# Patient Record
Sex: Male | Born: 1945
Health system: Southern US, Community
[De-identification: ages and names within clinical notes are randomized; demographics above are authoritative.]

## PROBLEM LIST (undated history)

## (undated) DIAGNOSIS — Z87442 Personal history of urinary calculi: Secondary | ICD-10-CM

## (undated) DIAGNOSIS — R569 Unspecified convulsions: Secondary | ICD-10-CM

## (undated) DIAGNOSIS — L570 Actinic keratosis: Secondary | ICD-10-CM

## (undated) DIAGNOSIS — N200 Calculus of kidney: Secondary | ICD-10-CM

## (undated) DIAGNOSIS — I340 Nonrheumatic mitral (valve) insufficiency: Secondary | ICD-10-CM

## (undated) DIAGNOSIS — I803 Phlebitis and thrombophlebitis of lower extremities, unspecified: Secondary | ICD-10-CM

## (undated) DIAGNOSIS — M1990 Unspecified osteoarthritis, unspecified site: Secondary | ICD-10-CM

## (undated) DIAGNOSIS — Z9889 Other specified postprocedural states: Secondary | ICD-10-CM

## (undated) HISTORY — DX: Unspecified convulsions: R56.9

## (undated) HISTORY — DX: Phlebitis and thrombophlebitis of lower extremities, unspecified: I80.3

## (undated) HISTORY — DX: Calculus of kidney: N20.0

## (undated) HISTORY — DX: Nonrheumatic mitral (valve) insufficiency: I34.0

## (undated) HISTORY — DX: Other specified postprocedural states: Z98.890

## (undated) HISTORY — DX: Personal history of urinary calculi: Z87.442

## (undated) HISTORY — PX: VARICOSE VEIN SURGERY: SHX832

## (undated) HISTORY — DX: Unspecified osteoarthritis, unspecified site: M19.90

## (undated) HISTORY — DX: Actinic keratosis: L57.0

---

## 2000-07-08 DIAGNOSIS — N200 Calculus of kidney: Secondary | ICD-10-CM

## 2003-07-09 HISTORY — PX: COLONOSCOPY: SHX174

## 2006-11-20 ENCOUNTER — Encounter: Payer: Self-pay | Admitting: Cardiology

## 2006-11-20 DIAGNOSIS — Z720 Tobacco use: Secondary | ICD-10-CM | POA: Insufficient documentation

## 2006-12-03 ENCOUNTER — Ambulatory Visit: Payer: Self-pay | Admitting: Cardiology

## 2006-12-16 ENCOUNTER — Ambulatory Visit: Payer: Self-pay

## 2006-12-16 ENCOUNTER — Encounter: Payer: Self-pay | Admitting: Cardiology

## 2007-01-02 ENCOUNTER — Ambulatory Visit: Payer: Self-pay | Admitting: Cardiology

## 2007-07-09 HISTORY — PX: OTHER SURGICAL HISTORY: SHX169

## 2007-12-02 ENCOUNTER — Ambulatory Visit: Payer: Self-pay | Admitting: Cardiology

## 2007-12-02 ENCOUNTER — Encounter: Payer: Self-pay | Admitting: Cardiology

## 2007-12-08 ENCOUNTER — Ambulatory Visit: Payer: Self-pay | Admitting: Cardiology

## 2007-12-21 ENCOUNTER — Ambulatory Visit: Payer: Self-pay | Admitting: Thoracic Surgery (Cardiothoracic Vascular Surgery)

## 2007-12-24 ENCOUNTER — Encounter
Admission: RE | Admit: 2007-12-24 | Discharge: 2007-12-24 | Payer: Self-pay | Admitting: Thoracic Surgery (Cardiothoracic Vascular Surgery)

## 2007-12-31 ENCOUNTER — Ambulatory Visit: Payer: Self-pay | Admitting: Cardiology

## 2007-12-31 ENCOUNTER — Encounter (INDEPENDENT_AMBULATORY_CARE_PROVIDER_SITE_OTHER): Payer: Self-pay | Admitting: *Deleted

## 2007-12-31 LAB — CONVERTED CEMR LAB
BUN: 24 mg/dL — ABNORMAL HIGH (ref 6–23)
CO2: 21 meq/L (ref 19–32)
Chloride: 109 meq/L (ref 96–112)
Glucose, Bld: 115 mg/dL — ABNORMAL HIGH (ref 70–99)
HCT: 40.3 % (ref 39.0–52.0)
Hemoglobin: 12.9 g/dL — ABNORMAL LOW (ref 13.0–17.0)
MCHC: 32 g/dL (ref 30.0–36.0)
MCV: 92.2 fL (ref 78.0–100.0)
Potassium: 4.5 meq/L (ref 3.5–5.3)
RBC: 4.37 M/uL (ref 4.22–5.81)

## 2008-01-05 ENCOUNTER — Encounter: Payer: Self-pay | Admitting: Internal Medicine

## 2008-01-05 ENCOUNTER — Ambulatory Visit: Payer: Self-pay | Admitting: Internal Medicine

## 2008-01-05 ENCOUNTER — Inpatient Hospital Stay (HOSPITAL_BASED_OUTPATIENT_CLINIC_OR_DEPARTMENT_OTHER): Admission: RE | Admit: 2008-01-05 | Discharge: 2008-01-05 | Payer: Self-pay | Admitting: Internal Medicine

## 2008-01-05 ENCOUNTER — Ambulatory Visit (HOSPITAL_COMMUNITY): Admission: RE | Admit: 2008-01-05 | Discharge: 2008-01-05 | Payer: Self-pay | Admitting: Internal Medicine

## 2008-01-05 HISTORY — PX: CARDIAC CATHETERIZATION: SHX172

## 2008-01-13 ENCOUNTER — Ambulatory Visit: Payer: Self-pay | Admitting: Cardiology

## 2008-02-15 ENCOUNTER — Ambulatory Visit (HOSPITAL_COMMUNITY)
Admission: RE | Admit: 2008-02-15 | Discharge: 2008-02-15 | Payer: Self-pay | Admitting: Thoracic Surgery (Cardiothoracic Vascular Surgery)

## 2008-02-15 ENCOUNTER — Ambulatory Visit: Payer: Self-pay | Admitting: Thoracic Surgery (Cardiothoracic Vascular Surgery)

## 2008-02-15 ENCOUNTER — Ambulatory Visit: Payer: Self-pay | Admitting: *Deleted

## 2008-02-15 ENCOUNTER — Encounter: Payer: Self-pay | Admitting: Thoracic Surgery (Cardiothoracic Vascular Surgery)

## 2008-02-18 ENCOUNTER — Inpatient Hospital Stay (HOSPITAL_COMMUNITY)
Admission: RE | Admit: 2008-02-18 | Discharge: 2008-02-22 | Payer: Self-pay | Admitting: Thoracic Surgery (Cardiothoracic Vascular Surgery)

## 2008-02-18 ENCOUNTER — Encounter: Payer: Self-pay | Admitting: Thoracic Surgery (Cardiothoracic Vascular Surgery)

## 2008-02-18 ENCOUNTER — Ambulatory Visit: Payer: Self-pay | Admitting: Thoracic Surgery (Cardiothoracic Vascular Surgery)

## 2008-02-24 ENCOUNTER — Ambulatory Visit: Payer: Self-pay | Admitting: Internal Medicine

## 2008-02-26 ENCOUNTER — Ambulatory Visit: Payer: Self-pay | Admitting: Cardiovascular Disease

## 2008-02-29 ENCOUNTER — Encounter
Admission: RE | Admit: 2008-02-29 | Discharge: 2008-02-29 | Payer: Self-pay | Admitting: Thoracic Surgery (Cardiothoracic Vascular Surgery)

## 2008-02-29 ENCOUNTER — Ambulatory Visit: Payer: Self-pay | Admitting: Thoracic Surgery (Cardiothoracic Vascular Surgery)

## 2008-03-01 ENCOUNTER — Ambulatory Visit: Payer: Self-pay | Admitting: Cardiology

## 2008-03-17 ENCOUNTER — Ambulatory Visit: Payer: Self-pay | Admitting: Internal Medicine

## 2008-03-21 ENCOUNTER — Ambulatory Visit: Payer: Self-pay | Admitting: Internal Medicine

## 2008-03-28 ENCOUNTER — Ambulatory Visit: Payer: Self-pay | Admitting: Cardiology

## 2008-04-25 ENCOUNTER — Ambulatory Visit: Payer: Self-pay | Admitting: Internal Medicine

## 2008-04-29 ENCOUNTER — Ambulatory Visit: Payer: Self-pay | Admitting: Cardiovascular Disease

## 2008-05-09 ENCOUNTER — Ambulatory Visit: Payer: Self-pay | Admitting: Thoracic Surgery (Cardiothoracic Vascular Surgery)

## 2008-05-24 ENCOUNTER — Ambulatory Visit: Payer: Self-pay | Admitting: Cardiology

## 2009-03-03 DIAGNOSIS — N529 Male erectile dysfunction, unspecified: Secondary | ICD-10-CM

## 2009-05-08 ENCOUNTER — Ambulatory Visit: Payer: Self-pay | Admitting: Cardiology

## 2009-05-08 DIAGNOSIS — I059 Rheumatic mitral valve disease, unspecified: Secondary | ICD-10-CM | POA: Insufficient documentation

## 2009-05-09 ENCOUNTER — Encounter: Payer: Self-pay | Admitting: Cardiology

## 2009-05-09 ENCOUNTER — Ambulatory Visit: Payer: Self-pay

## 2010-05-08 ENCOUNTER — Telehealth (INDEPENDENT_AMBULATORY_CARE_PROVIDER_SITE_OTHER): Payer: Self-pay | Admitting: *Deleted

## 2010-05-22 ENCOUNTER — Encounter: Payer: Self-pay | Admitting: Cardiology

## 2010-05-22 ENCOUNTER — Ambulatory Visit: Payer: Self-pay

## 2010-05-24 ENCOUNTER — Telehealth: Payer: Self-pay | Admitting: Cardiology

## 2010-08-08 NOTE — Progress Notes (Signed)
Summary: test results   Phone Note Call from Patient Call back at Home Phone [phone]   Caller: Patient Summary of Call: Pt returning call for test results Initial call taken by: Judie Grieve,  May 24, 2010 4:15 PM  Follow-up for Phone Call        Pt aware of ECHO results. Mylo Red RN

## 2010-08-08 NOTE — Progress Notes (Signed)
Summary: CALLED PT   Phone Note Outgoing Call Call back at [facility] Phone [phone]   Call placed by: Harlon Flor,  May 08, 2010 10:52 AM Call placed to: Patient Summary of Call: [facility] TO SCHEDULE ECHO Initial call taken by: Harlon Flor,  May 08, 2010 10:52 AM

## 2010-10-11 ENCOUNTER — Encounter: Payer: Self-pay | Admitting: *Deleted

## 2010-10-15 ENCOUNTER — Encounter: Payer: Self-pay | Admitting: Cardiology

## 2010-10-15 ENCOUNTER — Ambulatory Visit (INDEPENDENT_AMBULATORY_CARE_PROVIDER_SITE_OTHER): Payer: 59 | Admitting: Cardiology

## 2010-10-15 VITALS — BP 134/84 | HR 83 | Resp 18 | Ht 73.0 in | Wt 195.0 lb

## 2010-10-15 DIAGNOSIS — I059 Rheumatic mitral valve disease, unspecified: Secondary | ICD-10-CM

## 2010-10-15 DIAGNOSIS — I341 Nonrheumatic mitral (valve) prolapse: Secondary | ICD-10-CM

## 2010-10-15 NOTE — Patient Instructions (Signed)
Your physician recommends that you schedule a follow-up appointment in: 1 year with Dr. Wall  

## 2010-10-15 NOTE — Assessment & Plan Note (Signed)
Stable

## 2010-10-15 NOTE — Progress Notes (Signed)
   Patient ID: Scott Potts, male    DOB: 1946-04-24, 65 y.o.   MRN: [id number]  HPI Scott Potts returns for E and M of his MVP and repair. He is doing well with no exertional limitations. He works out and plays golf on a regular basis. Last Echo was 11/11 and was normal with trivial Scott.  EKG show NSR with LAFB and IRBBB.   Review of Systems  All other systems reviewed and are negative.      Physical Exam  Constitutional: He is oriented to person, place, and time. He appears well-developed and well-nourished.  HENT:  Head: Normocephalic and atraumatic.  Eyes: EOM are normal.  Neck: Neck supple. No JVD present. No tracheal deviation present. No thyromegaly present.  Cardiovascular: Normal rate, regular rhythm and normal heart sounds.   No murmur heard. Pulmonary/Chest: Effort normal and breath sounds normal.  Abdominal: Soft. Bowel sounds are normal.  Musculoskeletal: He exhibits no edema.  Neurological: He is alert and oriented to person, place, and time.  Skin: Skin is warm and dry.  Psychiatric: He has a normal mood and affect.

## 2010-11-20 NOTE — Cardiovascular Report (Signed)
NAMEJARETT, DRALLE                 ACCOUNT NO.:  [id number]   MEDICAL RECORD NO.:  [id number]          PATIENT TYPE:  AMB   LOCATION:  ENDO                         FACILITY:  [facility]   PHYSICIAN:  Bevelyn Buckles. Bensimhon, MDDATE OF BIRTH:  [birth year]   DATE OF PROCEDURE:  DATE OF DISCHARGE:                            CARDIAC CATHETERIZATION   PRIMARY CARE PHYSICIAN:  Lorie Phenix.   CARDIOLOGIST:  Jesse Sans. Wall, MD, [facility]   PATIENT IDENTIFICATION:  [name] is a very pleasant 65 year old male  who was found to have mitral valve prolapse with severe mitral  regurgitation.  He is referred for a right and left heart  catheterization for preparation for mitral valve repair.   PROCEDURES PERFORMED:  1. Right heart cath.  2. Selective coronary angiography.  3. Left heart cath.  4. Left ventriculogram.   DESCRIPTION OF PROCEDURE:  The risks and indication were explained.  Consent was signed and placed on the chart.  A 4-French arterial sheath  was placed in the left femoral artery using the modified Seldinger  technique.  Standard catheters were used including a JL-4 3-D RCA and  angled pigtail.  A 7-French venous sheath was placed in the left femoral  vein using the modified Seldinger technique and a standard Swan-Ganz  catheter was used.   HEMODYNAMICS:  Right atrial pressure mean of 7, RV pressure 26/5.  PA  pressure 27/11 with a mean of 19.  Pulmonary capillary wedge pressure  mean of 14 with V-waves to 25.  Central aortic pressure 124/76 with a  mean of 99.  LV pressure 115/39 with EDP of 20.  Fick cardiac output was  6.5 L per minute.  Cardiac index was 3.1 L per minute per meter squared.   Left main was normal.   LAD gave off 2 diagonal branches that were normal.   Left circumflex gave off a long but small-to-moderate sized ramus, a  large OM-1, and two small posterolaterals that were angiographically  normal.  Right coronary artery was a large dominant vessel gave off a  PDA and posterolateral was angiographically normal.   Left ventriculogram done in the RAO position showed an EF of 60% with no  wall motion abnormalities.  There was 3+ mitral regurgitation.   ASSESSMENT:  1. Normal coronary arteries.  2. Normal left ventricular function.  3. Normal right heart pressures.  4. Severe mitral regurgitation secondary to mitral valve prolapse.   Plan will be for him to follow up with Dr. Cornelius Moras for mitral valve repair.      Bevelyn Buckles. Bensimhon, MD  Electronically Signed     DRB/MEDQ  D:  01/05/2008  T:  01/06/2008  Job:  [id number]   cc:   Golden Pop Daleen Squibb, MD, [facility]  Clarence H. Cornelius Moras, M.D.

## 2010-11-20 NOTE — Assessment & Plan Note (Signed)
[facility] OFFICE NOTE   Scott Potts, Scott Potts                        MRN:          [id number]  DATE:03/01/2008                            DOB:          11-02-1945    Ms. [name] comes in today.  He is about 2 weeks out from mitral valve  repair with Dr. Cornelius Moras.   He has done remarkably well and is actually swinging a golf club again!  He denies any shortness of breath, pleuritic chest pain, only some chest  wall soreness from the incision.  He denies any peripheral edema,  palpitations, syncope, or presyncope.   His meds are,  1. Toprol-XL 25 mg a day.  2. Aspirin 81 mg a day.  3. Coumadin as directed.  His INR was therapeutic on February 26, 2008,      at 2.4.  He will probably be on Coumadin for a total of 6-12 weeks      depending on how Dr. Cornelius Moras feels on his return followup.   His exam today, his blood pressure is 126/80, his pulse is 96 and  regular.  He has no JVD.  Lungs are clear.  There is no rub lateral  incision is healing nicely.  Heart reveals a nondisplaced PMI.  There is  no murmur.  There is no gallop.  There is no rub.   Scott Potts is doing remarkably well.  I have made no changes in his  medical program except I have told him he can stop his aspirin since his  coronaries were normal.  He will see Dr. Cornelius Moras back in October.  At that  time, he may stop his Coumadin.  I will see him back in November as well  and at that time we may stop his Toprol.     Thomas C. Daleen Squibb, MD, [facility]  Electronically Signed    TCW/MedQ  DD: 03/01/2008  DT: 03/02/2008  Job #: [id number]   cc:   Salvatore Decent. Cornelius Moras, M.D.  Lorie Phenix

## 2010-11-20 NOTE — Assessment & Plan Note (Signed)
OFFICE VISIT   Scott Potts, Scott Potts  DOB:  10-04-1945                                        February 29, 2008  CHART #:  [id number]   HISTORY OF PRESENT ILLNESS:  The patient returns for routine followup  status post right miniature thoracotomy for mitral valve repair on  February 18, 2008.  The patient's postoperative recovery has been entirely  uneventful.  Since hospital discharge, he has done remarkably well.  He  has had minimal soreness, and he is not taking any pain medicine  whatsoever.  Initially, he had poor appetite, but this has resolved and  he is now eating well.  He has no shortness of breath.  He is now  walking several miles a day without any problems.  He is eager to  increase his activity level further.  He has had his Coumadin dosing  monitored through the [facility] and so far is doing well.  He has no complaints.  Medications remain unchanged from the time of  hospital discharge.   PHYSICAL EXAMINATION:  GENERAL:  Notable for a well-appearing male with  blood pressure 110/72, pulse 90, oxygen saturation 97% on room air.  CHEST:  Reveals a well-healing right miniature thoracotomy incision.  His chest tube sutures are removed.  Breath sounds are clear to  auscultation.  No wheezes or rhonchi noted.  CARDIOVASCULAR:  Demonstrates regular rate and rhythm.  No murmurs are  noted on exam.  ABDOMEN:  Soft, nontender.  The right groin incision is healing nicely.  EXTREMITIES:  Warm and well perfused.  There is no lower extremity  edema.  Distal pulses are palpable at the ankle on both feet.   DIAGNOSTIC TESTS:  Chest x-ray obtained today at the [facility] is reviewed.  This demonstrates clear lung fields bilaterally.  There are no pleural effusions.  No other abnormalities are noted.   IMPRESSION:  The patient is doing quite well now less than 2 weeks  following his right miniature thoracotomy for mitral valve repair.   He  looks very good.   PLAN:  I have encouraged the patient to continue to gradually increase  his physical activity as tolerated.  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  He plans to go back to work next week.  I think he can  start playing golf as long as he sticks to a short game with limited  swing for another week or two, and within a couple of weeks, he can be  back to unrestricted activity.  All of his questions have been  addressed.  We will plan to keep him on Coumadin for 2 months or so  following his surgery.  He plans to see Dr. Daleen Squibb in followup tomorrow.  We will see him back in 2 months' time, at which point we will stop the  Coumadin if he continues to do well.   Salvatore Decent. Cornelius Moras, M.D.  Electronically Signed   CHO/MEDQ  D:  02/29/2008  T:  03/01/2008  Job:  [id number]   cc:   Thomas C. Daleen Squibb, MD, [facility]  Lorie Phenix

## 2010-11-20 NOTE — Assessment & Plan Note (Signed)
[facility] OFFICE NOTE   Scott Potts, Scott Potts                        MRN:          [id number]  DATE:01/13/2008                            DOB:          1945-10-17    Scott Potts returns today post catheterization and transesophageal echo.  He has severe mitral regurgitation secondary to prolapse, normal  coronary arteries.  He has already seen Dr. Tressie Stalker for a possible  repair which may be as early as August.  Scott Potts has a followup visit  soon for that.   His only concern today is that his blood pressure has been increasing.  I do note that today it is 154/86, last visit it was 147/82.  This may  need treatment postop.   His cath site is stable.  There is no bruit.  Rest of the exam is  unchanged.   All questions were answered and addressed.  He will let me know when his  surgery is scheduled.      Thomas C. Wall, MD, [facility]  Electronically Signed     Jesse Sans. Daleen Squibb, MD, [facility]  Electronically Signed   TCW/MedQ  DD: 01/13/2008  DT: 01/14/2008  Job #: [id number]   cc:   Electa Sniff. Cornelius Moras, M.D.

## 2010-11-20 NOTE — Op Note (Signed)
NAMECANDACE, Scott Potts                 ACCOUNT NO.:  [id number]   MEDICAL RECORD NO.:  [id number]          PATIENT TYPE:  INP   LOCATION:  2303                         FACILITY:  [facility]   PHYSICIAN:  Guadalupe Maple, M.D.  DATE OF BIRTH:  11-27-1945   DATE OF PROCEDURE:  02/18/2008  DATE OF DISCHARGE:                               OPERATIVE REPORT   PROCEDURE:  Intraoperative transesophageal echocardiography.   Mr. Layman Gully is a 65 year old white male with a history of severe  mitral regurgitation who is scheduled to undergo mitral valve repair by  Dr. Cornelius Moras.  A transesophageal echocardiography was requested to evaluate  the mitral valve to determine the mechanism of regurgitation and to  assess the adequacy of repair.   The patient was brought to the operating room of [facility] and  general anesthesia was induced without difficulty.  The trachea was  intubated without difficulty.  The transesophageal echocardiography  probe was then inserted into the esophagus without difficulty.   IMPRESSION:  Prebypass findings:  1. Mitral valve.  There was severe mitral regurgitation.  There was a      flail segment of the posterior leaflet in the P2 region with severe      mitral regurgitation, which was anteriorly directed along the      superior surface of the anterior leaflet.  There was a large area      of proximal flow convergence but regurgitant orifice area could not      be determined due to the angle of the jet, which could not be      aligned properly with the continuous wave Doppler; however, it      appeared to be severe.  The anterior leaflet appeared normal.      There was no evidence of flail segment, significant myxomatous      changes, or prolapsing areas.  There was no significant mitral      annular calcification.  2. Aortic valve.  The aortic valve was normal-appearing.  It was      trileaflet, opened normally without evidence of stenosis and there      was no  evidence of any aortic insufficiency.  There was no      calcification of the aortic leaflets.  3. Left ventricle.  The left ventricular cavity was dilated and      appeared hyperdynamic with good contractility and all segments      interrogated.  Ejection fraction estimated at 65%.  The ventricular      shape was globular.  The left ventricular wall thickness measured      1.1 cm in diastole at the posterior wall at the mid papillary      level.  Left ventricular end-diastolic diameter was 6.1 cm.  There      was no thrombus noted in the left ventricular apex.  4. Right ventricle.  The right ventricular size was normal.  There was      good contractility of the right ventricular free wall.  5. Tricuspid valve.  The tricuspid valve did appear somewhat  myxomatous and redundant and there was trace tricuspid      insufficiency. 6.  Interatrial septum.  The interatrial septum was      intact without evidence of patent foramen ovale or atrial septal      defect.  6. Left atrium.  The left atrial size appeared to be enlarged.  There      was no thrombus noted in the left atrial or left atrial appendage.  7. Ascending aorta.  The ascending aorta was well-defined with well-      defined root and sinotubular ridge and no atheromatous disease      noted.  8. Descending aorta.  The descending aorta appeared normal.  There was      no atheromatous disease noted and diameter was 2.7 cm.   Post Bypass Findings:  1. Mitral valve.  There was a mitral annuloplasty ring present.  The      posterior leaflet was immobile and the anterior leaflet opened      freely.  There was trace mitral insufficiency, which appeared      insignificant.  There was no evidence of systolic anterior motion      of the anterior leaflet and continuous wave Doppler interrogation      of the left ventricular outflow tract showed no evidence of outflow      tract obstruction.  2. Aortic valve.  The aortic valve was  unchanged from the prebypass      study.  There was normal opening of the valve and no aortic      insufficiency.  3. Left ventricle.  There was good contractility of the left ventricle      inner cavity and wall motion was normal in all segments      interrogated.  The ejection fraction was again estimated at 60-65%.           ______________________________  Guadalupe Maple, M.D.     DCJ/MEDQ  D:  02/18/2008  T:  02/19/2008  Job:  [id number]

## 2010-11-20 NOTE — Assessment & Plan Note (Signed)
OFFICE VISIT   Scott Potts, Scott Potts  DOB:  [birth year]                                        May 09, 2008  CHART #:  [id number]   HISTORY OF PRESENT ILLNESS:  The patient returns for routine followup  status post right miniature thoracotomy for mitral valve repair on  February 18, 2008.  He was last seen here in the office on February 29, 2008.  Since then, he has done remarkably well.  The patient reports  that his return to normal activity was even quicker than he anticipated.  He played 18 holes of golf and shot a 74 less than 2 weeks following his  surgery.  He returned to work and is back enjoying entirely normal  activity without any problems or complaints.  His exercise tolerance is  quite good.  He has no shortness of breath.  He has no fevers or chills.  He has no residual pain and he really did not have much pain at all even  early after the surgery.  He is delighted with how things had  progressed.  His only complaint is that it took sometime to get his  Coumadin dosing adjusted.  The remainder of his review of systems is  entirely unrevealing.   CURRENT MEDICATIONS:  Toprol-XL 25 mg daily and Coumadin 7.5 mg daily.   PHYSICAL EXAMINATION:  GENERAL:  The patient is a well-appearing male.  VITAL SIGNS:  Blood pressure 121/74, pulse 71 and regular, and oxygen  saturation 97% on room air.  CHEST:  The small right miniature thoracotomy incision healed  completely.  Breath sounds are clear to auscultation and symmetrical  bilaterally.  No wheezes or rhonchi are noted.  CARDIOVASCULAR:  Demonstrates regular rate and rhythm.  No murmurs,  rubs, or gallops are noted.  ABDOMEN:  Soft and nontender.  The right groin incision is healed  completely.  EXTREMITIES:  Warm and well perfused.  Pulses are palpable.  There is no  lower extremity edema.   IMPRESSION:  The patient has done extremely well following right mini-  thoracotomy for mitral valve repair.  I  think, we can stop his Coumadin  at this point in time.   PLAN:  I have instructed the patient to stop his Coumadin.  I have  suggested that he restart taking 81 mg of aspirin daily.  Whether or not  he needs to stay on Toprol-XL, I will defer to Dr. Daleen Squibb.  At some point,  a followup echocardiogram would be good to see, and the patient is not  sure if he has had one since his surgery yet.  All of his questions have  been addressed.  In the future, he will call or return to see [place] here at  [facility] only should further problems or  difficulties arise.   Salvatore Decent. Cornelius Moras, M.D.  Electronically Signed   CHO/MEDQ  D:  05/09/2008  T:  05/09/2008  Job:  [id number]   cc:   Thomas C. Daleen Squibb, MD, [facility]  Lorie Phenix

## 2010-11-20 NOTE — Assessment & Plan Note (Signed)
[facility] OFFICE NOTE   KENNAN, DETTER                          MRN:          [id number]  DATE:12/03/2006                            DOB:          [birth year]    I was asked to consult on Scott Potts by Dr. Lorie Phenix at [facility] concerning a murmur.   HISTORY OF PRESENT ILLNESS:  Scott Potts is a very active, pleasant, 65-  year-old, white male who went for an annual physical on Nov 20, 2006.  A  systolic murmur, grade 2/6, was heard.  This has not been heard before  per the patient.   He has no cardiac history.  He has no symptoms of dyspnea on exertion,  chest discomfort, palpitations, presyncope, or syncope, peripheral  edema, orthopnea, or PND.   PAST MEDICAL HISTORY:  Significant for intolerance to penicillin.  He  smokes less than 1/4 of a pack of cigarettes a day, and has for 20  years.  He drinks 1 alcoholic beverage a day.  He does have 24 ounces of  caffeinated beverage a day.  He does not have elevated lipids.  He  exercises on a universal machine, and walks around 20 minutes to an hour  daily.  He is in excellent physical shape on outward appearance.   FAMILY HISTORY:  Negative for premature coronary disease.   SURGICAL HISTORY:  He had a vein strip of the left leg in 1985.   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  He is divorced, has 2 children.  Lives in [place].   REVIEW OF SYSTEMS:  Other than kidney stone, is totally negative review  of systems.   EXAMINATION:  His blood pressure is 132/84.  Pulse 62 and regular.  He  is 6 feet 1 inch, and weighs 185.  HEENT:  Normocephalic and atraumatic.  PERRLA.  Extraocular movements  intact.  Sclerae are clear.  Facial symmetry is normal.  Dentition is  satisfactory.  NECK:  Supple.  Carotid upstrokes are equal bilaterally.  There is no  bisferiens quality or delay.  Thyroid is not  enlarged.  Trachea is  midline.  LUNGS:  Clear.  CHEST:  Exam reveals a nondisplaced PMI.  He has a normal S1, and a  split S2.  He has a late mid systolic murmur heard best at the apex, and  left lateral decubitus position.  I can hear no diastolic inflow murmur.  There is no gallop.  There is no insufficiency murmur along the left  sternal border.  Standing, his murmur gets louder.  It is grade 2 to 3  over 6 systolic, mid systolic.  ABDOMINAL EXAM:  Soft.  Good bowel sounds.  EXTREMITIES:  Reveal no cyanosis, clubbing, or edema.  Pulses are  intact.  NEUROLOGIC:  Exam is intact.  His EKG shows sinus rhythm with a normal PR, QRS, and QTC interval.  He  does have an RSR prime in V1 and V2.  I have no old EKGs to compare.   LABORATORY  DATA:  From Dr. Santiago Bur office was reviewed, and is really  unremarkable.  This was discussed with the patient, including his lipid  panel.  Specifically, he is not anemic.   ASSESSMENT:  1. Mid to late systolic murmur most consistent with late prolapse of      the mitral valve.  Possibility of hypertrophic obstructive      cardiomyopathy should be raised as well.  I doubt this is an aortic      outflow murmur based on what I am hearing.  2. Tobacco use.  3. History of vein stripping.  4. RSR prime in V1 and V2, asymptomatic.   RECOMMENDATIONS:  I have discussed this in detail with the patient using  a heart model.  A 2-D echocardiogram is warranted to differentiate the  etiology of this murmur.  We will call him with the results.  Assuming  that it is nothing major, we will see him back in a year, or p.r.n.     Scott C. Daleen Squibb, MD, [facility]  Electronically Signed    TCW/MedQ  DD: 12/03/2006  DT: 12/03/2006  Job #: [id number]

## 2010-11-20 NOTE — Discharge Summary (Signed)
[name], Scott Potts                 ACCOUNT NO.:  [id number]   MEDICAL RECORD NO.:  [id number]          PATIENT TYPE:  OUT   LOCATION:  VASC                         FACILITY:  [facility]   PHYSICIAN:  Salvatore Decent. Cornelius Moras, M.D. DATE OF BIRTH:  [birth year]   DATE OF ADMISSION:  02/15/2008  DATE OF DISCHARGE:  02/15/2008                               DISCHARGE SUMMARY   FINAL DIAGNOSIS:  Severe mitral regurgitation.   IN-HOSPITAL DIAGNOSES:  1. Acute blood loss anemia postoperatively.  2. Volume overload postoperatively.   HISTORY AND PHYSICAL AND HOSPITAL COURSE:  1. Status post vein stripping.  2. History of nephrolithiasis.  3. Left lower extremity thrombophlebitis.   IN-HOSPITAL OPERATIONS AND PROCEDURES:  Right miniature thoracotomy for  mitral valve repair with triangular resection of posterior leaflet with  30 mm Edwards Physio II Radio broadcast assistant.   THE PATIENT'S HISTORY AND PHYSICAL AND HOSPITAL COURSE:  The patient is  a 65 year old previously healthy male who was first noted to have a  murmur on physical exam 2 years previously.  Echocardiogram revealed  mitral valve prolapse with mitral regurgitation.  The patient has been  followed by Dr. Daleen Squibb and recently returned with followup.  Echo  demonstrating severe mitral regurgitation with increased left  ventricular chamber size.  The patient remains asymptomatic.  Transesophageal echocardiogram confirmed the presence of flail posterior  leaflet of the mitral valve with severe 4+ mitral regurgitation.  Cardiac catheterization done confirmed the presence of severe mitral  regurgitation and demonstrates normal coronary artery anatomy with no  significant coronary artery disease.  Dr. Cornelius Moras was consulted.  Dr. Cornelius Moras  discussed with the patient undergoing mini mitral valve repair.  He  discussed risks and benefits with the patient.  The patient acknowledged  understanding and agreed to proceed.  Surgery was scheduled for February 18, 2008.  For details of the patient's past medical history and  physical exam, please see dictated H&P.   The patient was taken to the operating room on February 18, 2008, where he  underwent right miniature thoracotomy for mitral valve repair.  He was  doing a triangular resection of posterior leaflet with 30 mm Edwards  Physio II Radio broadcast assistant.  The patient tolerated this procedure well  and was transferred to the Intensive Care Unit in stable condition.  Postoperatively, the patient was noted to be hemodynamically stable.  He  was extubated evening of surgery.  Post extubation, the patient was  noted to be alert and oriented x4 and neuro intact.  The patient's  postoperative course was pretty much unremarkable.  Postop day 1, the  patient was weaned from all drips.  Chest x-ray noted to be stable.  Mediastinal chest tube discontinued as well as lines.  Pleural chest  tube was left in place.  He was noted to be bradycardic and was  temporarily atrial paced.  Blood pressure was stable.  He did have some  mild anemia with hemoglobin/hematocrit of 10.7 and 31%, this was stable.  The patient was felt to be stable and ready for transfer out to [facility].  The  patient continued to progress well while on the floor.  Pacer was  turned off and heart rate remained in the 70s.  Blood pressure remained  stable.  He was tolerating a low-dose beta blocker well.  The patient  remained in normal sinus rhythm postoperatively.  H&H was monitored and  remained stable.  The patient did not require any transfusions  postoperatively.  He did have some mild volume overload started on  diuretics for.  This improved prior to discharge home.  Repeat chest x-  ray was done daily.  By postop day 3, he did have a small right apical  pneumothorax.  We planned to discontinue pleural chest tube today.  We  will check chest x-ray post discontinuation of chest tube.  The patient  was started on Coumadin for mitral valve  repair.  Daily PT/INRs were  obtained.  Coumadin was dosed appropriately.  The patient continued to  progress well with ambulation.  He was tolerating diet well.  No nausea  or vomiting noted.  All incisions are clean, dry, and intact and healing  well.   The patient is tentatively ready for discharge home in the a.m., postop  day 3, February 21, 2008, pending removed chest tube today and chest x-ray  remained stable.   FOLLOW-UP APPOINTMENTS:  Followup appointment will be arranged with Dr.  Cornelius Moras for 1 week.  The patient will need to obtain PA and lateral chest x-  ray 30 minutes prior to this appointment.  The patient will need to  follow up with Dr. Daleen Squibb in 2 weeks.  He will need to contact Dr. Vern Claude  office to make these arrangements.  The patient will need to obtain  PT/INR blood work 2 days following discharge.   ACTIVITY:  The patient instructed no driving until released to do so, no  heavy lifting over 10 pounds.  He is told to ambulate 2-4 times per day,  progress as tolerated, and to continue his breathing exercises.   INCISIONAL CARE:  The patient is told to shower, washing his incisions  using soap and water.  He is to contact the office if he develops any  drainage or opening from any of his incision sites.   DIET:  Patient educated on diet to be low-fat, low-salt.   DISCHARGE MEDICATIONS:  1. Aspirin 81 mg daily.  2. Toprol XL 25 mg daily.  3. Coumadin will be dosed based on the patient's discharge PT/INR      level.  4. Ultram 50 mg 1-2 tablets q.4-6 hours p.r.n.      Theda Belfast, PA      Salvatore Decent. Cornelius Moras, M.D.  Electronically Signed    KMD/MEDQ  D:  02/21/2008  T:  02/22/2008  Job:  [id number]   cc:   Salvatore Decent. Cornelius Moras, M.D.  Thomas C. Wall, MD, [facility]

## 2010-11-20 NOTE — Consult Note (Signed)
NEW PATIENT CONSULTATION   Scott Potts, Scott Potts  DOB:  June 13, 1946                                        December 21, 2007  CHART #:  [id number]   REQUESTING PHYSICIAN:  Jesse Sans. Daleen Squibb, MD, [facility]   PRIMARY CARE PHYSICIAN:  Dr. Lorie Phenix.   REASON FOR CONSULTATION:  Severe mitral regurgitation.   HISTORY OF PRESENT ILLNESS:  Scott Potts is a 65 year old previously  healthy male who was first noted to have a heart murmur on routine  physical exam 2 years ago.  An echocardiogram was performed  demonstrating mitral valve prolapse with mitral regurgitation.  Since  then, he has been followed by Dr. Daleen Squibb.  He recently returned for  routine followup.  Echocardiogram performed on Dec 02, 2007, shows  mitral valve prolapse with likely severe mitral regurgitation.  There  has been some increased size of left ventricular chamber over the  interval past year since his last echo.  Scott Potts remains  asymptomatic, but he is now referred for consultation to consider  elective mitral valve repair.   REVIEW OF SYSTEMS:  GENERAL:  The patient feels well.  He has normal  exercise tolerance.  He has normal appetite.  HEENT:  Negative.  The patient has good dentition and sees his dentist  twice annually for routine followup.  CARDIAC:  The patient specifically denies any shortness of breath either  with activity or at rest.  He remains active physically.  He denies any  tachy palpitations.  He denies any chest pain.  He has not had any dizzy  spells.  RESPIRATORY:  Negative.  The patient denies productive cough,  hemoptysis, or wheezing.  GASTROINTESTINAL:  Negative.  The patient reports normal bowel function.  He has no difficulty swallowing.  He denies hematochezia, hematemesis,  or melena.  GENITOURINARY:  Negative.  The patient reports no urinary urgency or  frequency.  PERIPHERAL VASCULAR:  Negative.  The patient denies symptoms suggestive  of claudication.  NEUROLOGIC:   Negative.  PSYCHIATRIC:  Negative.  HEMATOLOGIC:  Negative.   PAST MEDICAL HISTORY:  1. Kidney stone.  2. Left lower extremity thrombophlebitis.   PAST SURGICAL HISTORY:  1. Transurethral extraction of kidney stone.  2. Left lower extremity vein stripping.   FAMILY HISTORY:  Noncontributory.  Notable specifically for the absence  of any family members with history of valvular heart disease, Marfan  syndrome, aortic dissection, or other collagen vascular diseases.   SOCIAL HISTORY:  The patient is divorced and lives alone in [place].  He has 2 grown children with 1 grandchild.  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  He remains very active physically.  He has a  long-standing history of tobacco use, although he smokes very small  amounts of cigarettes, perhaps 1 pack of cigarettes per week at most.  He has never been a heavy smoker.  He denies excessive alcohol  consumption.   CURRENT MEDICATIONS:  None.   DRUG ALLERGIES:  PENICILLIN caused swelling during his childhood.   PHYSICAL EXAMINATION:  General:  The patient is a thin well-appearing  white male who appears of stated age in no acute distress.  Vital Signs:  Blood pressure 146/87, pulse 60, and oxygen saturation 98% on room air.  HEENT:  Unrevealing.  Neck:  Supple.  There are no carotid  bruits.  There is no jugular venous distention.  There is no palpable  lymphadenopathy.  Chest:  Auscultation of the chest demonstrates clear  breath sounds, which are symmetrical bilaterally.  No wheezes or rhonchi  noted.  Cardiovascular:  Notable for regular rate and rhythm.  There is  a very prominent 4/6 holosystolic murmur heard best at the apex with  radiation to the axilla and to the back.  No diastolic murmurs are  noted.  Abdomen:  Flat, soft, nondistended, and nontender.  There are no  palpable masses.  The liver edge is not palpable.  Extremities:  Warm  and well perfused.  Femoral pulses are strong and easily  palpable on  both sides.  Distal pulses are palpable in the feet.  There is no sign  of significant venous insufficiency.  Rectal:  Deferred.  GU:  Deferred.  Neurologic:  Grossly nonfocal and symmetrical throughout.   DIAGNOSTIC TESTS:  A 2-D echocardiogram performed on Dec 02, 2007, is  reviewed.  This demonstrates mitral valve prolapse with severe prolapse  involving what appears to be the middle scallop of the posterior leaflet  of the mitral valve (P2).  There is a jet of mitral regurgitation  coursing anteriorly around the left atrium consistent with severe mitral  regurgitation.  There is mild left ventricular chamber enlargement.  There is normal left ventricular systolic function.  The aortic valve is  normal and there is no aortic insufficiency.  The aortic root is  somewhat dilated.   IMPRESSION:  Mitral valve prolapse with probably severe mitral  regurgitation.  Scott Potts claims to remain asymptomatic, although there  had been some interval increased size of his left ventricular chamber  over the last year between serial echocardiograms.  I think it would be  reasonable to pursue further diagnostic workup to consider elective  mitral valve repair.  Assuming he does not have significant coronary  artery disease, Scott Potts would likely represent an excellent candidate  for minimally invasive approach.  He does have an enlarged aortic root;  he certainly could conceivably have an ascending thoracic aortic  aneurysm.  We will plan CT angiogram for further preoperative screening  purposes.   PLAN:  I have discussed matters at length with Scott Potts in the office  today.  Alternative treatment strategies have been discussed including  continued close observation with medical therapy versus proceeding with  elective mitral valve repair.  The relative risks and benefits of each  of these strategies have been discussed.  Scott Potts desires to proceed  with elective surgery some  time near March 08, 2008.  We will obtain  CT angiogram of the thoracic and abdominal aorta and its branches, both  to size his ascending thoracic aorta and to screen him for peripheral  vascular disease with respect to tentative plans for minimally invasive  surgical approach.  At some point, he will need left and right heart  catheterization and a transesophageal echocardiogram.  We will ask that  cardiac catheterization be performed through the left femoral approach  if feasible and we will plan using the right femoral approach for  cannulation, presuming that he remains  a candidate for minimally  invasive technique.  All of his questions have been addressed.  We will  plan to see Scott Potts back here in the office for followup on February 22, 2008.   Salvatore Decent. Cornelius Moras, M.D.  Electronically Signed   CHO/MEDQ  D:  12/21/2007  T:  12/22/2007  Job:  [id number]   cc:   Thomas C. Daleen Squibb, MD, [facility]  Lorie Phenix

## 2010-11-20 NOTE — Assessment & Plan Note (Signed)
[facility] OFFICE NOTE   Scott Potts, CATANZARO                          MRN:          [id number]  DATE:01/02/2007                            DOB:          May 02, 1946    Scott Potts returns today to discuss the findings of his 2D  echocardiogram.   He has mitral valve prolapse to the anterior leaflet. There is no  evidence of HOCM. He has moderate mitral valve regurgitation and mild  left atrial dilation. His left atrium specifically measured 30 which is  actually normal. It certainly is mild. His left ventricle in end  diastole is normal with no sign of dilation.   He is totally asymptomatic. He does do a fair amount of weight lifting  but promises me he does not strain.   I have had about a 20 minute discussion with him today. I have  recommended no isometric lifting but cardio and isotonic lifting is ok.  We talked about the controversy over ACE inhibitors to provide  __________ reduction. He really does not want to take a medication.   We will plan on seeing him back in 6 months. We will certainly reecho  him every year. At some point he will probably need mitral valve repair  which we discussed at length.     Thomas C. Daleen Squibb, MD, [facility]  Electronically Signed    TCW/MedQ  DD: 01/02/2007  DT: 01/02/2007  Job #: [id number]   cc:   Lorie Phenix

## 2010-11-20 NOTE — H&P (Signed)
NAMEKEIRAN, GAFFEY                 ACCOUNT NO.:  [id number]   MEDICAL RECORD NO.:  [id number]          PATIENT TYPE:  AMB   LOCATION:  ENDO                         FACILITY:  [facility]   PHYSICIAN:  Bevelyn Buckles. Bensimhon, MDDATE OF BIRTH:  February 09, 1946   DATE OF ADMISSION:  01/05/2008  DATE OF DISCHARGE:                              HISTORY & PHYSICAL   PRIMARY CARDIOLOGIST:  Maisie Fus C. Daleen Squibb, MD, [facility]   PRIMARY CARE PHYSICIAN:  Dr. Leatha Gilding.   CARDIOVASCULAR SURGEON:  Salvatore Decent. Cornelius Moras, MD   HISTORY OF PRESENT ILLNESS:  This is a 65 year old Caucasian male with  no prior history of cardiac disease who was initially evaluated by Dr.  Juanito Doom in the [place] office secondary to a 2/6 systolic murmur.  The patient was asymptomatic with this and continues to be very active  walking and exercising each day.  An echocardiogram was completed in May  2009 revealing mitral valve prolapse, likely severe MR with increased LV  size (see attached echo report for more details).  He was referred to  Dr. Tressie Stalker and was seen by him on December 21, 2007 for evaluation  and consideration of mitral valve repair with preop evaluation (see  attached note from Dr. Barry Dienes).  Dr. Barry Dienes recommended a right and left  heart cath and a TEE.  The patient is here for TEE and cath today with  more recommendations per Dr. Gala Romney after procedures are completed.   REVIEW OF SYSTEMS:  Negative for shortness of breath, nausea, vomiting,  chest pain, dizziness, or lack of energy.   PAST MEDICAL HISTORY:  1. Vein stripping.  2. Kidney stones.  3. Left lower extremity thrombophlebitis.   FAMILY HISTORY:  Negative for premature CAD and valvular disease.   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  Positive for tobacco, use one-quarter pack a day and 1 drink daily.   PAST SURGICAL HISTORY:  Vein stripping.   CURRENT MEDICATIONS:  None.   ALLERGIES:  PENICILLIN causing swelling.   CURRENT  LABORATORY DATA:  Hemoglobin 12.9, hematocrit 40.3, white blood  cells 6.5, platelets 227.  Sodium 143, potassium 4.5, chloride 109, CO2  21, glucose 115, BUN 24, creatinine 0.85, calcium 9.0.  Chest x-ray  revealing normal two-view chest examination.  Echocardiogram dated Dec 02, 2007 revealing moderately severe prolapse of the posterior leaflet,  eccentric anterior MR that is consistent with posterior prolapse.  MR is  at least moderate as it wraps the atrium.  There is mild redundancy of  the mitral leaflets with no significant calcification.  Mean transmitral  gradient was 1.5 mmHg.  Mitral valve area pressure half-time was 3.0 and  5 cm2 per Dr. Willa Rough.   PHYSICAL EXAMINATION:  VITAL SIGNS:  Blood pressure 147/85, heart rate  67, respirations 20, O2 sat 97%.  HEENT:  Head is normocephalic and atraumatic.  Eyes, PERRLA.  Mucous  membranes of mouth are pink and moist.  Tongue is midline.  NECK:  Supple without JVD or carotid bruits appreciated.  CARDIOVASCULAR:  Regular rate and rhythm with 2/6  systolic murmur  auscultated.  There are no rubs or gallops noted.  LUNGS:  Clear to auscultation without wheezes, rales, or rhonchi.  ABDOMEN:  Soft, nontender, 2+ bowel sounds.  EXTREMITIES:  Without clubbing, cyanosis, or edema.  Dorsalis pedis  pulses and posterior tibial pulses with radial pulses are 1+  bilaterally.  SKIN:  Warm and dry.  NEUROLOGIC:  Intact.   IMPRESSION:  Severe mitral valve regurgitation.   PLAN:  The patient is scheduled for a TEE per Dr. Arvilla Meres and  right and left heart cath following.  The patient will have further  recommendations post procedure for consideration for mitral valve repair  or replacement.      Bettey Mare. Lyman Bishop, NP      Bevelyn Buckles. Bensimhon, MD  Electronically Signed    KML/MEDQ  D:  01/05/2008  T:  01/05/2008  Job:  [id number]   cc:   Thomas C. Daleen Squibb, MD, [facility]  Clarence H. Cornelius Moras, M.D.  Leatha Gilding, M.D.

## 2010-11-20 NOTE — H&P (Signed)
HISTORY AND PHYSICAL EXAMINATION   February 15, 2008   Re:  Scott Potts, Scott Potts           DOB:  1946/05/12   Date of planned hospital admission:  February 18, 2008.   HISTORY OF PRESENT ILLNESS:  The patient returns for further followup  related to mitral valve prolapse with severe mitral regurgitation.  He  was originally seen in consultation on December 21, 2007.  Since then he  underwent transesophageal echocardiogram and right heart catheterization  on January 05, 2008.  Transesophageal echocardiogram confirms the presence  of myxomatous mitral valve disease with flail posterior leaflet of the  mitral valve and severe (4+) mitral regurgitation.  There is normal left  ventricular size and function.  No other significant abnormalities were  appreciated.  Cardiac catheterization confirmed the presence of severe  mitral regurgitation.  There was normal coronary arteries with no  significant coronary artery disease.  Pulmonary artery pressures were  normal, measuring 27/11 with pulmonary capillary wedge pressure of 14  and a V wave of 25.  Resting cardiac output was 6.5 liters per minute  corresponding to cardiac index of 3.1.  The patient also underwent CT  angiogram of the thoracic and abdominal aorta as well as the iliac  vessels.  This was performed on December 24, 2007.  There was mild  dilatation of the ascending thoracic aorta, measuring 4 x 3.6 cm in its  greatest diameter.  There was no sign of significant atherosclerotic  disease.  No other abnormalities were noted.  Prostate gland was mildly  enlarged.   Since his last office visit, the patient remains clinically stable and  entirely asymptomatic.  His review of systems and physical exam are  entirely unchanged from previously.  He is not taking any regular  medications.  For the remainder of the details of his history and  physical exam, please see his previous consultation report dated December 21, 2007.   I have again  reviewed the indications, risks, and potential benefits of  elective mitral valve repair with the patient here in the office today.  The relative risks and benefits of use of minimally invasive approach to  reconstruction of his valve have been discussed.  He understands and  accepts all potential associated risks of surgery including but not  limited to risk of death, stroke, myocardial infarction, congestive  heart failure, respiratory failure, pneumonia, bleeding requiring blood  transfusion, arrhythmia, heart block, bradycardia, requiring permanent  pacemaker, recurrent right pleural effusion related to minimally  invasive approach, possible pain or numbness in the right groin or right  leg, possible vascular compromise to the right lower extremity.  He  understands that we feel there is a very high likelihood that his valve  can be successfully repaired.  If for some reason his valve cannot be  repaired, he specifically requests that we use a mechanical prosthesis.  He understands that this would be associated with the need for long-term  anticoagulation and associated risks of bleeding and/or  thromboembolization.  All of his questions have been addressed.  We plan  to proceed with surgery on Thursday, February 18, 2008.   Salvatore Decent. Cornelius Moras, M.D.  Electronically Signed   CHO/MEDQ  D:  02/15/2008  T:  02/15/2008  Job:  [id number]   cc:   Golden Pop. Wall, MD, [facility]

## 2010-11-20 NOTE — Op Note (Signed)
NAMEJEREMIYAH, CULLENS                 ACCOUNT NO.:  [id number]   MEDICAL RECORD NO.:  [id number]          PATIENT TYPE:  INP   LOCATION:  2303                         FACILITY:  [facility]   PHYSICIAN:  Salvatore Decent. Cornelius Moras, M.D. DATE OF BIRTH:  Jan 06, 1946   DATE OF PROCEDURE:  DATE OF DISCHARGE:                               OPERATIVE REPORT   PREOPERATIVE DIAGNOSIS:  Severe mitral regurgitation.   POSTOPERATIVE DIAGNOSIS:  Severe mitral regurgitation.   PROCEDURE:  Right miniature thoracotomy for mitral valve repair  (triangular resection of posterior leaflet with 30-mm Edwards Physio II  ring annuloplasty).   SURGEON:  Salvatore Decent. Cornelius Moras, MD   ASSISTANT:  Doree Fudge, PA   ANESTHESIA:  General.   BRIEF CLINICAL NOTE:  The patient is a 65 year old previously healthy  male who was first noted to have a murmur on physical exam 2 years  previously.  Echocardiograms reveal mitral valve prolapse with mitral  regurgitation.  The patient has been followed by Dr. Valera Castle and  recently returned with followup echo demonstrating severe mitral  regurgitation with increased left ventricular chamber size.  The patient  remains asymptomatic.  Transesophageal echocardiogram confirms the  presence of flail posterior leaflet of the mitral valve (P2) with severe  (4+) mitral regurgitation.  Cardiac catheterization confirms the  presence of severe mitral regurgitation and demonstrates normal coronary  artery anatomy with no significant coronary artery disease.  A full  consultation note has been dictated previously.  The patient has been  counseled at length regarding the indications, risks, and potential  benefits of surgery.  Alternative treatment strategies have been  reviewed.  He understands and accepts all associated risks and desires  to proceed with surgery as described.   OPERATIVE FINDINGS:  1. Flail middle scallop (P2) of the posterior leaflet of the mitral      valve with severe  (4+) mitral regurgitation.  2. Otherwise fairly normal mitral valve anatomy consistent with      fibroelastic deficiency type myxomatous degeneration.  3. Mild-to-moderately dilated left ventricle with normal left      ventricular systolic function.  4. No residual mitral regurgitation following successful mitral valve      repair.   OPERATIVE NOTE IN DETAIL:  The patient was brought to the operating room  on the above-mentioned date and central monitoring was established by  the Anesthesia Service under the care and direction of Dr. Kipp Brood.  Specifically, a Swan-Ganz catheter was placed through the left internal  jugular approach.  A radial arterial line was placed.  Intravenous  antibiotics were administered.  The patient was placed in the supine  position on the operating table.  General endotracheal anesthesia was  induced uneventfully.  Initially, a dual-lumen endotracheal tube was  placed.  A Foley catheter was placed.   Baseline transesophageal echocardiogram was performed by Dr. Noreene Larsson.  This confirms the presence of myxomatous mitral valve degenerative  disease with flail middle scallop (P2) of the posterior leaflet of  mitral valve and severe (4+) mitral regurgitation.  The left ventricle  was somewhat dilated.  There  was normal left ventricular systolic  function.  The aortic valve appears normal.  No other abnormalities were  noted.   The patient was positioned on the table with a roll behind the right  scapula along the right side.  The patient's head was turned gently  towards the left side.  The patient's right neck, chest, abdomen, both  groins, and both lower extremities were prepared and draped in sterile  manner.   A small oblique incision was made in the right inguinal crease.  The  right common femoral artery and common femoral vein were dissected  through the incision and inspected.  They were normal in appearance.   A right miniature anterolateral  thoracotomy incision was performed  immediately lateral to the right nipple.  The serratus anterior muscle  was preserved and retracted anteriorly.  Single-lung ventilation was  begun.  The right chest was entered through the fourth intercostal  space.  A small incision was made in the anterior axillary line  overlying the seventh intercostal space.  Through this incision, an 11-  mm endoscopic port was passed into the right pleural space.  Carbon  dioxide gas was insufflated into the right chest throughout the  remainder of the operation through this port.  A longitudinal incision  was made in the pericardium 2 cm anterior to the phrenic nerve.  Silk  pericardial retraction stitches were placed and pulled through separate  stab incisions with an Endoclose retrieval to facilitate exposure of the  pericardial sac.   The right common femoral vein was cannulated using the Seldinger  technique with needle placed through this greater saphenous bulb into  the common femoral vein.  The femoral guidewire was advanced through the  femoral vein in the inferior vena cava into the right atrium and into  the superior vena cava using transesophageal echocardiogram for  guidance.  The right internal jugular vein was then cannulated in the  right neck using the Seldinger technique, and a guidewire was advanced  in the superior vena cava again with transesophageal echocardiogram for  guidance.  The patient was heparinized systemically.  A 23 x 25 long  femoral venous cannula was then advanced after serial dilatation of the  common femoral vein through the greater saphenous bulb into the femoral  vein and up through the inferior vena cava and through the right atrium  until the tip can be appreciated in the superior vena cava using  transesophageal echocardiogram for guidance.  A 16-French femoral  cannula was then advanced over serial dilators into the internal jugular  vein until the tip was in the  superior vena cava.  CD-4 Gore-Tex  pursestring sutures were placed in both the femoral vein and the femoral  artery, and the right common femoral artery was then cannulated using  the Seldinger technique through the pursestring suture.  A 19-French  femoral arterial cannula was advanced after serial dilatation into the  common femoral artery.   Adequate heparinization was verified.  Cardiopulmonary bypass was begun.  The pericardial incision is now extended and both directions to  facilitate full exposure of the heart and aorta.  The underside of the  aorta was dissected away from the anterior surface of the right  pulmonary artery to facilitate subsequent cross-clamp placement.  Prolene 4-0 pursestring sutures were placed in the right lateral wall of  the ascending aorta just above the right atrial appendage for antegrade  cardioplegic cannula placement.  Prolene 4-0 suture was then placed in  the right atrium.  A retrograde cardioplegic catheter was placed through  the right atrium into the coronary sinus using transesophageal  echocardiogram for guidance.  An antegrade cardioplegic cannula was then  placed through the pursestring suture into the ascending aorta.   The patient was cooled to 28 degrees systemic temperature.  Aortic cross-  clamp was applied, and cold blood cardioplegia was administered  initially in antegrade fashion through the aortic root.  Supplemental  cardioplegia was administered retrograde through the coronary sinus  catheter.  The initial cardioplegic arrest was excellent.  Repeat doses  of cardioplegia were administered intermittently throughout the cross-  clamp portion of the operation retrograde through the coronary sinus  catheter to maintain completely isoelectric electrocardiogram.  Cardioplegia was administered every 20 minutes.   A left atriotomy incision was performed posteriorly through the  interatrial groove.  The mitral valve was exposed using a  left atrial  retractor which was attached to a bar placed directly through the fourth  intercostal space just lateral to the right margin of the sternum.  Exposure was felt to be excellent.  The mitral valve was carefully  examined.  There is myxomatous fibroelastic deficiency type prolapse of  the mitral valve with a flail middle scallop (P2) of the posterior  leaflet.  Two of the primary chordae tendineae in the segment of the  leaflet are ruptured.  The remainder of the valve appears essentially  normal, and there were no other areas of prolapse.  The subvalvular  apparatus was otherwise normal.   Interrupted 2-0 Ethibond horizontal mattress sutures were placed  circumferentially around the mitral annulus to serve as annuloplasty  sutures and to facilitate exposure of the remainder of the repair.  After all the stitches were in place, the prolapsed portion of the  middle scallop is now carefully examined.  The prolapsed segment was  repaired by performing a triangular resection of the flail segment.  The  intervening vertical defect of the valve was then closed using  interrupted CV-6 Gore-Tex sutures.  At this juncture, the valve was  tested and it appears to be competent.  The valve was sized to accept a  30-mm annuloplasty ring.  This corresponds precisely to the dimensions  of the anterior leaflet.   An Edwards Physio II annuloplasty ring (model #[id number], serial [id number])  was secured in place uneventfully.  After completion of the valve  repair, the valve was again inspected for competency by instilling  saline into the left ventricular chamber.  The valve appears to be  perfectly competent with no residual mitral regurgitation.  There was a  symmetrical coaptation line of the anterior and posterior leaflet with  broad surface area of coaptation.  Rewarming was begun.   The left atriotomy incision was closed using a two-layer closure of  running 3-0 Prolene suture.  Left  ventricular vent was initially left  across the valve and kept in place with a Rumel tourniquet across the  atriotomy closure.  An epicardial pacing wire was fixed to the right  ventricular free wall inferiorly.  One final dose of warm retrograde hot  shot cardioplegia was administered.  The aortic cross-clamp was removed  after a total cross-clamp time of 128 minutes.   The heart began to beat spontaneously without need for cardioversion.  The retrograde and antegrade cardioplegic cannulas were removed and  their cannulation site inspected for hemostasis.  Epicardial pacing  wires were fixed to the right atrial appendage.  The patient was  rewarmed to 37 degrees centigrade temperature.  The lungs were  ventilated, and the heart allowed to fill briefly to evacuate any  residual air.  The left ventricular vent was then removed, and the Rumel  tourniquet secured to complete the atriotomy closure.  Bilateral lung  ventilation was then again resumed, and the patient was weaned from  cardiopulmonary bypass without difficulty.  The patient's rhythm at  separation from bypass was a junctional rhythm.  AV sequential pacing  was employed.  Normal sinus rhythm resumed spontaneously prior to  completion of the operation and atrial pacing was employed.  No  inotropic support was required.  Total cardiopulmonary bypass time for  the operation was 184 minutes.   Followup transesophageal echocardiogram performed by Dr. Noreene Larsson after  separation from bypass demonstrates a well-seated annuloplasty ring.  There was trivial residual mitral regurgitation.  There was no sign of  mitral stenosis.  The valve appears to be functioning normally.  There  was insignificant residual air.  The left ventricular function remains  preserved.   Protamine was administered to reverse the anticoagulation.  The venous  cannulas were then removed, and the internal jugular cannulation site  was controlled with a single silk  suture placed across Rumel pledgets.  The femoral venous cannula was removed, and the greater saphenous bulb  was ligated.  The femoral arterial cannula was removed, and the Gore-Tex  pursestring sutures were secured.  There is a palpable pulse in the  distal superficial femoral artery.   Single-lung ventilation is now resumed.  The chest was inspected for  hemostasis.  There was some bleeding behind the aorta on the anterior  surface of the right pulmonary artery.  This was controlled with direct  pressure and packing with Surgicel which controls the bleeding  completely.  There was no sign of any other bleeding of any  significance.  The pericardial space was drained with a 28-French Bard  drain exited through separate stab incision anteriorly.  The pericardial  sac was then gently reapproximated to close the incision in the  pericardium.   The On-Q continuous pain management system was utilized to facilitate  postoperative pain control.  One 5-inch catheters supplied with the On-Q  kit was tunneled initially through the subcutaneous tissues from  inferior to the right inferior chest tube incision.  The catheter was  then tunneled and placed in the subpleural space posteriorly to cover  the second through the sixth intercostal nerve roots.  The catheter was  flushed with 5 mL of 0.5% bupivacaine solution and ultimately connected  to a continuous infusion pump.  The right pleural space was drained with  a 28-French Bard drain.  The right miniature thoracotomy incision is now  closed in multiple layers and skin incision closed with subcuticular  skin closure.  The right groin incision was also closed in multiple  layers and skin incision closed with subcuticular skin closure.   The patient tolerated the procedure well.  The endotracheal tube was  exchanged for a single-lumen tube, and the patient was subsequently  transported to the Surgical Intensive Care Unit in stable condition.   There were no intraoperative complications.  All sponge, instrument, and  needle counts were verified correct at completion.  No blood products  were administered.      Salvatore Decent. Cornelius Moras, M.D.  Electronically Signed     CHO/MEDQ  D:  02/18/2008  T:  02/19/2008  Job:  [id number]   cc:   Thomas C.  Daleen Squibb, MD, [facility]  Lorie Phenix

## 2010-11-20 NOTE — Assessment & Plan Note (Signed)
[facility] OFFICE NOTE   Scott Potts, Scott Potts                          MRN:          [id number]  DATE:12/08/2007                            DOB:          1946-04-04    Scott Potts comes in today to discuss the findings of his 2D  echocardiogram.   He now has severe prolapse of the posterior leaflet of the mitral valve.  There is an eccentric anterior mitral regurgitant jet that is at least  moderate, but it the wraps around some of the atrium, suggesting it to  be severe.  There is mild redundancy of the mitral valve leaflets, no  significant calcification.  His left ventricle is also starting to  enlarge and is measuring 58 mm in end-diastole.  There is overall left  ventricular systolic function is normal.  His left atrial size  surprisingly measures 36 mm and is normal.   ASSESSMENT AND PLAN:  I have explained the current clinical status  thoroughly with Scott Potts.  I have recommended that he see Dr. Tressie Stalker in [place] for consideration of mitral valve repair.  He is in  agreement with this plan, and we will arrange an point.  I have a call  in to Dr. Cornelius Moras at the present time as well.  He will obviously need a  transesophageal echocardiogram and a cardiac catheterization before even  considering surgical intervention.     Thomas C. Daleen Squibb, MD, [facility]  Electronically Signed    TCW/MedQ  DD: 12/08/2007  DT: 12/08/2007  Job #: [id number]   cc:   Salvatore Decent. Cornelius Moras, M.D.  Lorie Phenix

## 2010-11-20 NOTE — Assessment & Plan Note (Signed)
[facility] OFFICE NOTE   Scott, Potts                        MRN:          [id number]  DATE:05/24/2008                            DOB:          05/12/1946    [name] returns today for followup.  He played golf Sunday in the  mud, and shot a 69!   He is having no chest pain, no shortness of breath.  No tachy  palpitations.   He ran out of his Toprol on Sunday.  He is on aspirin 81 mg a day and a  multivitamin.   PHYSICAL EXAMINATION:  VITAL SIGNS:  Today, his blood pressure is  126/80, his heart rate is 88 and regular, his weight is 187.  HEENT:  Normal.  LUNGS:  Clear.  HEART:  Regular rate and rhythm.  ABDOMEN:  Soft.  EXTREMITIES:  No cyanosis, clubbing, or edema.   [name] is doing remarkably well.  We will schedule for followup in  August 2010.  I have advised to stop his aspirin.     Thomas C. Daleen Squibb, MD, [facility]  Electronically Signed    TCW/MedQ  DD: 05/24/2008  DT: 05/24/2008  Job #: [id number]   cc:   Lorie Phenix

## 2011-04-01 ENCOUNTER — Emergency Department: Payer: Self-pay | Admitting: *Deleted

## 2011-04-04 LAB — POCT I-STAT 3, VENOUS BLOOD GAS (G3P V)
Acid-base deficit: 2
Bicarbonate: 23.6
O2 Saturation: 75
O2 Saturation: 83
Operator id: 141321
Operator id: 141321
pCO2, Ven: 45.9
pH, Ven: 7.335 — ABNORMAL HIGH
pH, Ven: 7.339 — ABNORMAL HIGH
pO2, Ven: 41

## 2011-04-04 LAB — POCT I-STAT 3, ART BLOOD GAS (G3+)
Bicarbonate: 23.9
TCO2: 25
pO2, Arterial: 92

## 2011-04-05 LAB — POCT I-STAT 4, (NA,K, GLUC, HGB,HCT)
Glucose, Bld: 104 — ABNORMAL HIGH
Glucose, Bld: 107 — ABNORMAL HIGH
Glucose, Bld: 82
HCT: 35 — ABNORMAL LOW
HCT: 35 — ABNORMAL LOW
Hemoglobin: 11.9 — ABNORMAL LOW
Hemoglobin: 9.5 — ABNORMAL LOW
Potassium: 3.4 — ABNORMAL LOW
Potassium: 4.1
Potassium: 4.3
Potassium: 5.3 — ABNORMAL HIGH
Sodium: 136
Sodium: 138

## 2011-04-05 LAB — GLUCOSE, CAPILLARY
Glucose-Capillary: 109 — ABNORMAL HIGH
Glucose-Capillary: 117 — ABNORMAL HIGH
Glucose-Capillary: 149 — ABNORMAL HIGH
Glucose-Capillary: 90

## 2011-04-05 LAB — POCT I-STAT 3, ART BLOOD GAS (G3+)
Acid-base deficit: 2
Bicarbonate: 23.3
Bicarbonate: 24.2 — ABNORMAL HIGH
O2 Saturation: 100
TCO2: 24
TCO2: 25
TCO2: 25
pCO2 arterial: 37.2
pCO2 arterial: 39.3
pH, Arterial: 7.362
pH, Arterial: 7.396
pO2, Arterial: 155 — ABNORMAL HIGH
pO2, Arterial: 514 — ABNORMAL HIGH
pO2, Arterial: 53 — ABNORMAL LOW
pO2, Arterial: 88

## 2011-04-05 LAB — BLOOD GAS, ARTERIAL
Acid-Base Excess: 0
Bicarbonate: 23.9
FIO2: 0.21
O2 Saturation: 94.8
Patient temperature: 98.6

## 2011-04-05 LAB — CBC
HCT: 31.3 — ABNORMAL LOW
HCT: 34 — ABNORMAL LOW
HCT: 34.7 — ABNORMAL LOW
HCT: 41.1
Hemoglobin: 10.7 — ABNORMAL LOW
Hemoglobin: 11.4 — ABNORMAL LOW
Hemoglobin: 11.6 — ABNORMAL LOW
Hemoglobin: 13.8
MCHC: 33.6
MCHC: 33.6
MCHC: 34.2
MCV: 89.9
MCV: 91.7
Platelets: 96 — ABNORMAL LOW
RBC: 3.7 — ABNORMAL LOW
RDW: 13.4
RDW: 13.4
RDW: 13.7
WBC: 11.9 — ABNORMAL HIGH
WBC: 13.6 — ABNORMAL HIGH

## 2011-04-05 LAB — HEMOGLOBIN AND HEMATOCRIT, BLOOD
HCT: 30.2 — ABNORMAL LOW
Hemoglobin: 10.1 — ABNORMAL LOW

## 2011-04-05 LAB — COMPREHENSIVE METABOLIC PANEL
BUN: 17
Calcium: 9.3
Creatinine, Ser: 0.83
GFR calc non Af Amer: >60
Glucose, Bld: 94
Sodium: 136
Total Protein: 6.2

## 2011-04-05 LAB — CREATININE, SERUM: GFR calc non Af Amer: >60

## 2011-04-05 LAB — TYPE AND SCREEN

## 2011-04-05 LAB — APTT
aPTT: 28
aPTT: 40 — ABNORMAL HIGH

## 2011-04-05 LAB — PROTIME-INR
INR: 1
INR: 1.3
Prothrombin Time: 16.7 — ABNORMAL HIGH

## 2011-04-05 LAB — POCT I-STAT, CHEM 8
Calcium, Ion: 1.25
Chloride: 107
HCT: 34 — ABNORMAL LOW
Potassium: 5.1
Sodium: 139

## 2011-04-05 LAB — POCT I-STAT 3, VENOUS BLOOD GAS (G3P V)
Bicarbonate: 23
TCO2: 25
pH, Ven: 7.273
pO2, Ven: 53 — ABNORMAL HIGH

## 2011-04-05 LAB — BASIC METABOLIC PANEL
CO2: 23
Calcium: 7.6 — ABNORMAL LOW
Glucose, Bld: 120 — ABNORMAL HIGH
Sodium: 135

## 2011-04-05 LAB — MAGNESIUM: Magnesium: 2.6 — ABNORMAL HIGH

## 2011-04-05 LAB — PLATELET COUNT: Platelets: 115 — ABNORMAL LOW

## 2011-04-05 LAB — HEMOGLOBIN A1C
Hgb A1c MFr Bld: 5.6
Mean Plasma Glucose: 122

## 2011-04-05 LAB — POCT I-STAT GLUCOSE: Glucose, Bld: 132 — ABNORMAL HIGH

## 2011-04-05 LAB — URINALYSIS, ROUTINE W REFLEX MICROSCOPIC
Bilirubin Urine: NEGATIVE
Hgb urine dipstick: NEGATIVE
Ketones, ur: NEGATIVE
Nitrite: NEGATIVE
Urobilinogen, UA: 0.2

## 2011-04-05 LAB — ABO/RH: ABO/RH(D): O POS

## 2011-04-11 ENCOUNTER — Ambulatory Visit: Payer: Self-pay | Admitting: Family Medicine

## 2011-04-15 ENCOUNTER — Ambulatory Visit: Payer: Self-pay | Admitting: Family Medicine

## 2011-10-16 ENCOUNTER — Encounter: Payer: Self-pay | Admitting: Cardiology

## 2011-10-16 ENCOUNTER — Ambulatory Visit (INDEPENDENT_AMBULATORY_CARE_PROVIDER_SITE_OTHER): Payer: 59 | Admitting: Cardiology

## 2011-10-16 VITALS — BP 142/82 | HR 75 | Ht 73.0 in | Wt 196.0 lb

## 2011-10-16 DIAGNOSIS — I493 Ventricular premature depolarization: Secondary | ICD-10-CM | POA: Insufficient documentation

## 2011-10-16 DIAGNOSIS — I059 Rheumatic mitral valve disease, unspecified: Secondary | ICD-10-CM

## 2011-10-16 DIAGNOSIS — I4949 Other premature depolarization: Secondary | ICD-10-CM

## 2011-10-16 NOTE — Patient Instructions (Signed)
Your physician recommends that you continue on your current medications as directed. Please refer to the Current Medication list given to you today.  Your physician recommends that you schedule a follow-up appointment in: 2 years with Dr. Daleen Squibb.  We will mail you a reminder 2 months prior to schedule your appointment

## 2011-10-16 NOTE — Progress Notes (Signed)
HPI  Scott Potts returns today for evaluation and management of his history of severe mitral bowel prolapse status post mitral valve repair. Continues to do remarkably well. He has an occasional PVC but he has no palpitations or chest pain. He is on no cardiac meds. Past Medical History  Diagnosis Date  . History of vein stripping   . Kidney stones   . Thrombophlebitis of lower extremities     left  . Mitral valve regurgitation   . History of nephrolithiasis   . Seizure     6 months ago while sleeping    Current Outpatient Prescriptions  Medication Sig Dispense Refill  . levETIRAcetam (KEPPRA) 500 MG tablet Take 500 mg by mouth daily.      . Multiple Vitamin (MULTIVITAMIN) tablet Take 1 tablet by mouth daily.          Allergies  Allergen Reactions  . Penicillins     Family History  Problem Relation Age of Onset  . Coronary artery disease Neg Hx   . Diabetes Neg Hx   . Hypertension Neg Hx     History   Social History  . Marital Status: Divorced    Spouse Name: N/A    Number of Children: 2  . Years of Education: N/A   Occupational History  . Not on file.   Social History Main Topics  . Smoking status: Former Games developer  . Smokeless tobacco: Not on file   Comment: quit 2009  . Alcohol Use: Yes     occasionally  . Drug Use: Not on file  . Sexually Active: Not on file   Other Topics Concern  . Not on file   Social History Narrative   Pt lives alone. Moderate amount of caffeine daily. Regular exercise.    ROS ALL NEGATIVE EXCEPT THOSE NOTED IN HPI  PE  General Appearance: well developed, well nourished in no acute distress HEENT: symmetrical face, PERRLA, good dentition  Neck: no JVD, thyromegaly, or adenopathy, trachea midline Chest: symmetric without deformity Cardiac: PMI non-displaced, RRR, normal S1, S2, no gallop or murmur Lung: clear to ausculation and percussion Vascular: all pulses full without bruits  Abdominal: nondistended, nontender, good bowel  sounds, no HSM, no bruits Extremities: no cyanosis, clubbing or edema, no sign of DVT, no varicosities  Skin: normal color, no rashes Neuro: alert and oriented x 3, non-focal Pysch: normal affect  EKG Normal sinus rhythm, occasional PVCs, left anterior fascicular block BMET    Component Value Date/Time   NA 135 02/19/2008 0500   K 4.3 02/19/2008 0500   CL 107 02/19/2008 0500   CO2 23 02/19/2008 0500   GLUCOSE 120* 02/19/2008 0500   BUN 14 02/19/2008 0500   CREATININE 0.67 02/19/2008 0500   CALCIUM 7.6* 02/19/2008 0500   GFRNONAA >60 02/19/2008 0500   GFRAA  Value: >60        The eGFR has been calculated using the MDRD equation. This calculation has not been validated in all clinical 02/19/2008 0500    Lipid Panel  No results found for this basename: chol, trig, hdl, cholhdl, vldl, ldlcalc    CBC    Component Value Date/Time   WBC 12.1* 02/19/2008 0500   RBC 3.49* 02/19/2008 0500   HGB 10.7* 02/19/2008 0500   HCT 31.3* 02/19/2008 0500   PLT 99* 02/19/2008 0500   MCV 89.8 02/19/2008 0500   MCHC 34.2 02/19/2008 0500   RDW 13.4 02/19/2008 0500

## 2011-10-16 NOTE — Assessment & Plan Note (Signed)
Status post repair and doing remarkably well. I will see him back in 2 years.

## 2013-10-22 ENCOUNTER — Ambulatory Visit (INDEPENDENT_AMBULATORY_CARE_PROVIDER_SITE_OTHER): Payer: 59 | Admitting: Cardiology

## 2013-10-22 ENCOUNTER — Encounter: Payer: Self-pay | Admitting: Cardiology

## 2013-10-22 VITALS — BP 120/88 | HR 85 | Ht 73.0 in | Wt 192.0 lb

## 2013-10-22 DIAGNOSIS — I4949 Other premature depolarization: Secondary | ICD-10-CM

## 2013-10-22 DIAGNOSIS — I493 Ventricular premature depolarization: Secondary | ICD-10-CM

## 2013-10-22 DIAGNOSIS — I059 Rheumatic mitral valve disease, unspecified: Secondary | ICD-10-CM

## 2013-10-22 NOTE — Progress Notes (Signed)
Patient ID: Scott Potts, male   DOB: 06-May-1946, 68 y.o.   MRN: [id number]    Patient Name: Scott Potts Date of Encounter: 10/22/2013  Primary Care Provider:  No primary provider on file. Primary Cardiologist: Dorothy Spark  Problem List   Past Medical History  Diagnosis Date  . History of vein stripping   . Kidney stones   . Thrombophlebitis of lower extremities     left  . Mitral valve regurgitation   . History of nephrolithiasis   . Seizure     6 months ago while sleeping   Past Surgical History  Procedure Laterality Date  . Varicose vein surgery      vein strip left leg  . Cardiac catheterization  01/05/08  . Endocardiogram  2009    Allergies  Allergies  Allergen Reactions  . Penicillins     HPI  68 year old male post mitral valve repair with annuloplasty ring in 2008 for severe MVP. He is doing remarkably well, exercising daily, walking 5 miles daily, playing golf. Completely asymptomatic. No palpitations, dizziness, chest pain or SOB.  He is on no cardiac meds.  Home Medications  Prior to Admission medications   Medication Sig Start Date End Date Taking? Authorizing Provider  Multiple Vitamin (MULTIVITAMIN) tablet Take 1 tablet by mouth daily.     Yes Historical Provider, MD    Family History  Family History  Problem Relation Age of Onset  . Coronary artery disease Neg Hx   . Diabetes Neg Hx   . Hypertension Neg Hx     Social History  History   Social History  . Marital Status: Divorced    Spouse Name: N/A    Number of Children: 2  . Years of Education: N/A   Occupational History  . Not on file.   Social History Main Topics  . Smoking status: Former Research scientist (life sciences)  . Smokeless tobacco: Not on file     Comment: quit 2009  . Alcohol Use: Yes     Comment: occasionally  . Drug Use: Not on file  . Sexual Activity: Not on file   Other Topics Concern  . Not on file   Social History Narrative   Pt lives alone. Moderate amount of caffeine  daily. Regular exercise.     Review of Systems, as per HPI, otherwise negative General:  No chills, fever, night sweats or weight changes.  Cardiovascular:  No chest pain, dyspnea on exertion, edema, orthopnea, palpitations, paroxysmal nocturnal dyspnea. Dermatological: No rash, lesions/masses Respiratory: No cough, dyspnea Urologic: No hematuria, dysuria Abdominal:   No nausea, vomiting, diarrhea, bright red blood per rectum, melena, or hematemesis Neurologic:  No visual changes, wkns, changes in mental status. All other systems reviewed and are otherwise negative except as noted above.  Physical Exam  Blood pressure 120/88, pulse 85, height 6\' 1"  (1.854 m), weight 192 lb (87.091 kg).  General: Pleasant, NAD Psych: Normal affect. Neuro: Alert and oriented X 3. Moves all extremities spontaneously. HEENT: Normal  Neck: Supple without bruits or JVD. Lungs:  Resp regular and unlabored, CTA. Heart: RRR no s3, s4, or murmurs. Abdomen: Soft, non-tender, non-distended, BS + x 4.  Extremities: No clubbing, cyanosis or edema. DP/PT/Radials 2+ and equal bilaterally.  Labs:  Accessory Clinical Findings  Echocardiogram - 05/22/2010  - Left ventricle: The cavity size was normal. Wall thickness was increased in a pattern of mild LVH. Systolic function was normal. The estimated ejection fraction was in the range of  65% to 70%. Wall motion was normal; there were no regional wall motion abnormalities. Doppler parameters are consistent with abnormal left ventricular relaxation (grade 1 diastolic dysfunction). - Mitral valve: Mitral valve annuloplasty ring in place. Trivial regurgitation. Peak gradient: 50mm Hg (D). Valve area by pressure half-time: 2.27cm^2. - Left atrium: The atrium was mildly dilated. - Tricuspid valve: Trivial regurgitation. - Pulmonic valve: Mild regurgitation. - Pulmonary arteries: Systolic pressure was within the normal range.  ECG - SR, iRBBB, LAFB,  RVH    Assessment & Plan  68 year old male  1. Status post repair in 2008 and doing remarkably well. I will see him back in 2 years. We will repeat echocardiogram since the last one was done 4 years ago.  2. BP - controlled on no medications  3. Lipids - followed by PCP yearly  Follow up in 2 years  Dorothy Spark, MD, [facility] 10/22/2013, 4:01 PM

## 2013-10-22 NOTE — Patient Instructions (Signed)
**Note De-Identified Scott Potts Obfuscation** Your physician recommends that you continue on your current medications as directed. Please refer to the Current Medication list given to you today.  Your physician has requested that you have an echocardiogram. Echocardiography is a painless test that uses sound waves to create images of your heart. It provides your doctor with information about the size and shape of your heart and how well your heart's chambers and valves are working. This procedure takes approximately one hour. There are no restrictions for this procedure.  Your physician wants you to follow-up in: 2 years. You will receive a reminder letter in the mail two months in advance. If you don't receive a letter, please call our office to schedule the follow-up appointment.

## 2013-11-04 ENCOUNTER — Ambulatory Visit (HOSPITAL_COMMUNITY): Payer: Medicare PPO | Attending: Cardiovascular Disease | Admitting: Radiology

## 2013-11-04 ENCOUNTER — Encounter: Payer: Self-pay | Admitting: Cardiovascular Disease

## 2013-11-04 DIAGNOSIS — I059 Rheumatic mitral valve disease, unspecified: Secondary | ICD-10-CM | POA: Insufficient documentation

## 2013-11-04 NOTE — Progress Notes (Signed)
Echocardiogram performed.  

## 2013-11-16 ENCOUNTER — Telehealth: Payer: Self-pay | Admitting: *Deleted

## 2013-11-16 NOTE — Telephone Encounter (Signed)
Pt contacted about unchanged echo and everything within normal limits per Dr Meda Coffee.  Pt verbalized understanding and pleased with this news.

## 2013-11-16 NOTE — Telephone Encounter (Signed)
Message copied by Nuala Alpha on Tue Nov 16, 2013  7:57 AM ------      Message from: Dorothy Spark      Created: Tue Nov 16, 2013 12:07 AM       Unchanged echocardiogram and everything within normal limits. Would you let him know?      Thank you,      K ------

## 2013-12-14 ENCOUNTER — Encounter: Payer: Self-pay | Admitting: *Deleted

## 2014-01-13 ENCOUNTER — Ambulatory Visit (INDEPENDENT_AMBULATORY_CARE_PROVIDER_SITE_OTHER): Payer: Medicare PPO | Admitting: General Surgery

## 2014-01-13 ENCOUNTER — Encounter: Payer: Self-pay | Admitting: General Surgery

## 2014-01-13 VITALS — BP 122/86 | HR 82 | Resp 12 | Ht 73.0 in | Wt 187.0 lb

## 2014-01-13 DIAGNOSIS — Z1211 Encounter for screening for malignant neoplasm of colon: Secondary | ICD-10-CM

## 2014-01-13 MED ORDER — POLYETHYLENE GLYCOL 3350 17 GM/SCOOP PO POWD: ORAL | Status: DC

## 2014-01-13 NOTE — Patient Instructions (Addendum)
Colonoscopy A colonoscopy is an exam to look at the entire large intestine (colon). This exam can help find problems such as tumors, polyps, inflammation, and areas of bleeding. The exam takes about 1 hour.  LET [facility] CARE PROVIDER KNOW ABOUT:   Any allergies you have.  All medicines you are taking, including vitamins, herbs, eye drops, creams, and over-the-counter medicines.  Previous problems you or members of your family have had with the use of anesthetics.  Any blood disorders you have.  Previous surgeries you have had.  Medical conditions you have. RISKS AND COMPLICATIONS  Generally, this is a safe procedure. However, as with any procedure, complications can occur. Possible complications include:  Bleeding.  Tearing or rupture of the colon wall.  Reaction to medicines given during the exam.  Infection (rare). BEFORE THE PROCEDURE   Ask your health care provider about changing or stopping your regular medicines.  You may be prescribed an oral bowel prep. This involves drinking a large amount of medicated liquid, starting the day before your procedure. The liquid will cause you to have multiple loose stools until your stool is almost clear or light green. This cleans out your colon in preparation for the procedure.  Do not eat or drink anything else once you have started the bowel prep, unless your health care provider tells you it is safe to do so.  Arrange for someone to drive you home after the procedure. PROCEDURE   You will be given medicine to help you relax (sedative).  You will lie on your side with your knees bent.  A long, flexible tube with a light and camera on the end (colonoscope) will be inserted through the rectum and into the colon. The camera sends video back to a computer screen as it moves through the colon. The colonoscope also releases carbon dioxide gas to inflate the colon. This helps your health care provider see the area better.  During  the exam, your health care provider may take a small tissue sample (biopsy) to be examined under a microscope if any abnormalities are found.  The exam is finished when the entire colon has been viewed. AFTER THE PROCEDURE   Do not drive for 24 hours after the exam.  You may have a small amount of blood in your stool.  You may pass moderate amounts of gas and have mild abdominal cramping or bloating. This is caused by the gas used to inflate your colon during the exam.  Ask when your test results will be ready and how you will get your results. Make sure you get your test results. Document Released: 06/21/2000 Document Revised: 04/14/2013 Document Reviewed: 03/01/2013 [facility] Patient Information 2015 [place], [place]. This information is not intended to replace advice given to you by your health care provider. Make sure you discuss any questions you have with your health care provider.  Patient has been scheduled for a colonoscopy on 02-09-14 at [facility].

## 2014-01-13 NOTE — Progress Notes (Signed)
Patient ID: Scott Potts, male   DOB: [birth year], 68 y.o.   MRN: [id number]  Chief Complaint  Patient presents with  . Other    colonoscopy    HPI Scott Potts is a 68 y.o. male who presents for an evaluation of a colonoscopy. The patient's last colonoscopy was performed in 2005. The patient denies any problems with his bowels at this time.   HPI  Past Medical History  Diagnosis Date  . History of vein stripping   . Kidney stones   . Thrombophlebitis of lower extremities     left  . Mitral valve regurgitation   . History of nephrolithiasis   . Seizure     6 months ago while sleeping    Past Surgical History  Procedure Laterality Date  . Varicose vein surgery      vein strip left leg  . Cardiac catheterization  01/05/08  . Endocardiogram  2009  . Heart valve repair  2009  . Colonoscopy  2005    Family History  Problem Relation Age of Onset  . Coronary artery disease Neg Hx   . Diabetes Neg Hx   . Hypertension Neg Hx     Social History History  Substance Use Topics  . Smoking status: Former Research scientist (life sciences)  . Smokeless tobacco: Not on file     Comment: quit 2009  . Alcohol Use: Yes     Comment: occasionally    Allergies  Allergen Reactions  . Penicillins     Unknown reaction    Current Outpatient Prescriptions  Medication Sig Dispense Refill  . levETIRAcetam (KEPPRA) 500 MG tablet Take 1 tablet by mouth 2 (two) times daily.      . Multiple Vitamin (MULTIVITAMIN) tablet Take 1 tablet by mouth daily.        . polyethylene glycol powder (GLYCOLAX/MIRALAX) powder 255 grams one bottle for colonoscopy prep  255 g  0   No current facility-administered medications for this visit.    Review of Systems Review of Systems  Constitutional: Negative.   Respiratory: Negative.   Cardiovascular: Negative.     Blood pressure 122/86, pulse 82, resp. rate 12, height 6\' 1"  (1.854 m), weight 187 lb (84.823 kg).  Physical Exam Physical Exam  Constitutional: He is oriented to  person, place, and time. He appears well-developed and well-nourished.  Eyes: Conjunctivae are normal.  Neck: Neck supple. No thyromegaly present.  Cardiovascular: Normal rate and regular rhythm.   Murmur heard.  Systolic murmur is present with a grade of 1/6  Pulmonary/Chest: Effort normal and breath sounds normal.  Abdominal: Normal appearance and bowel sounds are normal. There is no hepatosplenomegaly. No hernia.  Lymphadenopathy:    He has no cervical adenopathy.  Neurological: He is alert and oriented to person, place, and time.    Data Reviewed Prior colonoscopy  Assessment    Need for screening colonoscopy Stable exam    Plan    Discussed colonoscopy procedure and pt is agreeable      Patient has been scheduled for a colonoscopy on 02-09-14 at [facility].   SANKAR,SEEPLAPUTHUR G 01/13/2014, 11:39 AM

## 2014-02-02 ENCOUNTER — Other Ambulatory Visit: Payer: Self-pay | Admitting: General Surgery

## 2014-02-02 DIAGNOSIS — Z1211 Encounter for screening for malignant neoplasm of colon: Secondary | ICD-10-CM

## 2014-02-09 ENCOUNTER — Ambulatory Visit: Payer: Self-pay | Admitting: General Surgery

## 2014-02-09 DIAGNOSIS — K644 Residual hemorrhoidal skin tags: Secondary | ICD-10-CM

## 2014-02-09 DIAGNOSIS — K5730 Diverticulosis of large intestine without perforation or abscess without bleeding: Secondary | ICD-10-CM

## 2014-02-09 DIAGNOSIS — Z1211 Encounter for screening for malignant neoplasm of colon: Secondary | ICD-10-CM

## 2014-02-14 ENCOUNTER — Encounter: Payer: Self-pay | Admitting: General Surgery

## 2015-01-16 DIAGNOSIS — Z6824 Body mass index (BMI) 24.0-24.9, adult: Secondary | ICD-10-CM | POA: Diagnosis not present

## 2015-01-16 DIAGNOSIS — G40909 Epilepsy, unspecified, not intractable, without status epilepticus: Secondary | ICD-10-CM | POA: Diagnosis not present

## 2015-03-25 ENCOUNTER — Other Ambulatory Visit: Payer: Self-pay | Admitting: Family Medicine

## 2015-03-25 DIAGNOSIS — I059 Rheumatic mitral valve disease, unspecified: Secondary | ICD-10-CM

## 2015-03-27 DIAGNOSIS — Z8042 Family history of malignant neoplasm of prostate: Secondary | ICD-10-CM | POA: Insufficient documentation

## 2015-05-29 DIAGNOSIS — L821 Other seborrheic keratosis: Secondary | ICD-10-CM | POA: Diagnosis not present

## 2015-05-29 DIAGNOSIS — L820 Inflamed seborrheic keratosis: Secondary | ICD-10-CM | POA: Diagnosis not present

## 2015-05-29 DIAGNOSIS — D1800 Hemangioma unspecified site: Secondary | ICD-10-CM | POA: Diagnosis not present

## 2015-05-29 DIAGNOSIS — Z1283 Encounter for screening for malignant neoplasm of skin: Secondary | ICD-10-CM | POA: Diagnosis not present

## 2015-05-29 DIAGNOSIS — D229 Melanocytic nevi, unspecified: Secondary | ICD-10-CM | POA: Diagnosis not present

## 2015-05-29 DIAGNOSIS — L814 Other melanin hyperpigmentation: Secondary | ICD-10-CM | POA: Diagnosis not present

## 2015-05-29 DIAGNOSIS — L578 Other skin changes due to chronic exposure to nonionizing radiation: Secondary | ICD-10-CM | POA: Diagnosis not present

## 2015-05-29 DIAGNOSIS — D179 Benign lipomatous neoplasm, unspecified: Secondary | ICD-10-CM | POA: Diagnosis not present

## 2015-05-29 DIAGNOSIS — L570 Actinic keratosis: Secondary | ICD-10-CM | POA: Diagnosis not present

## 2015-06-15 ENCOUNTER — Other Ambulatory Visit: Payer: Self-pay

## 2015-06-15 ENCOUNTER — Ambulatory Visit (INDEPENDENT_AMBULATORY_CARE_PROVIDER_SITE_OTHER): Payer: Medicare PPO | Admitting: Family Medicine

## 2015-06-15 ENCOUNTER — Encounter: Payer: Self-pay | Admitting: Family Medicine

## 2015-06-15 VITALS — BP 126/74 | HR 68 | Temp 98.0°F | Resp 16 | Ht 73.0 in | Wt 192.0 lb

## 2015-06-15 DIAGNOSIS — I059 Rheumatic mitral valve disease, unspecified: Secondary | ICD-10-CM | POA: Diagnosis not present

## 2015-06-15 DIAGNOSIS — R319 Hematuria, unspecified: Secondary | ICD-10-CM | POA: Insufficient documentation

## 2015-06-15 DIAGNOSIS — R569 Unspecified convulsions: Secondary | ICD-10-CM | POA: Insufficient documentation

## 2015-06-15 DIAGNOSIS — Z0000 Encounter for general adult medical examination without abnormal findings: Secondary | ICD-10-CM | POA: Diagnosis not present

## 2015-06-15 DIAGNOSIS — Z23 Encounter for immunization: Secondary | ICD-10-CM | POA: Diagnosis not present

## 2015-06-15 DIAGNOSIS — G40909 Epilepsy, unspecified, not intractable, without status epilepticus: Secondary | ICD-10-CM | POA: Insufficient documentation

## 2015-06-15 DIAGNOSIS — Z8042 Family history of malignant neoplasm of prostate: Secondary | ICD-10-CM

## 2015-06-15 DIAGNOSIS — I493 Ventricular premature depolarization: Secondary | ICD-10-CM

## 2015-06-15 NOTE — Progress Notes (Signed)
Patient ID: [name], male   DOB: 1946-02-12, 69 y.o.   MRN: [id number]         Patient: Scott Potts, Male    DOB: 1945-11-09, 69 y.o.   MRN: [id number] Visit Date: 06/15/2015  Today's Provider: Margarita Rana, MD   Chief Complaint  Patient presents with  . Annual Exam   Subjective:    Annual wellness visit Scott Potts is a 69 y.o. male. He feels well. He reports exercising everyday. He reports he is sleeping well.  Seizures are under good control.   No acute concerns today.  Has remarried since last office visit.   -----------------------------------------------------------   Review of Systems  Constitutional: Negative.   HENT: Positive for tinnitus. Negative for congestion, dental problem, drooling, ear discharge, ear pain, facial swelling, hearing loss, mouth sores, nosebleeds, postnasal drip, rhinorrhea, sinus pressure, sneezing, sore throat, trouble swallowing and voice change.   Eyes: Negative.   Respiratory: Negative.   Cardiovascular: Negative.   Gastrointestinal: Negative.   Endocrine: Negative.   Genitourinary: Negative.   Musculoskeletal: Negative.   Skin: Negative.   Allergic/Immunologic: Negative.   Neurological: Positive for seizures (Under good control with Keppra). Negative for dizziness, tremors, syncope, facial asymmetry, speech difficulty, weakness, light-headedness, numbness and headaches.  Hematological: Negative.   Psychiatric/Behavioral: Negative.     Social History   Social History  . Marital Status: Married    Spouse Name: Rodena Piety  . Number of Children: 2  . Years of Education: N/A   Occupational History  . Retire [employer]   Social History Main Topics  . Smoking status: Former Research scientist (life sciences)  . Smokeless tobacco: Not on file     Comment: quit 2009  . Alcohol Use: Yes     Comment: occasionally  . Drug Use: No  . Sexual Activity: Not on file   Other Topics Concern  . Not on file   Social History Narrative   Pt lives alone. Moderate  amount of caffeine daily. Regular exercise.    Patient Active Problem List   Diagnosis Date Noted  . Blood in the urine 06/15/2015  . Seizure ([place]) 06/15/2015  . Family history of malignant neoplasm of prostate 03/27/2015  . Asymptomatic PVCs 10/16/2011  . MITRAL VALVE PROLAPSE, NON-RHEUMATIC 05/08/2009  . ED (erectile dysfunction) of organic origin 03/03/2009  . Disorder of mitral valve 11/23/2007  . Current tobacco use 11/20/2006  . Calculus of kidney 07/08/2000    Past Surgical History  Procedure Laterality Date  . Varicose vein surgery      vein strip left leg  . Cardiac catheterization  01/05/08  . Endocardiogram  2009  . Heart valve repair  2009  . Colonoscopy  2005    His family history is negative for Coronary artery disease, Diabetes, and Hypertension.    Previous Medications   LEVETIRACETAM (KEPPRA) 500 MG TABLET    Take 1 tablet by mouth 2 (two) times daily.   MULTIPLE VITAMIN (MULTIVITAMIN) TABLET    Take 1 tablet by mouth daily.      Patient Care Team: Margarita Rana, MD as PCP - General (Family Medicine) Margarita Rana, MD as Referring Physician (Family Medicine) Seeplaputhur Robinette Haines, MD (General Surgery)     Objective:   Vitals: BP 126/74 mmHg  Pulse 68  Temp(Src) 98 F (36.7 C) (Oral)  Resp 16  Ht 6\' 1"  (1.854 m)  Wt 192 lb (87.091 kg)  BMI 25.34 kg/m2  Physical Exam  Constitutional: He is oriented to person, place,  and time. He appears well-developed and well-nourished.  HENT:  Head: Normocephalic and atraumatic.  Right Ear: External ear normal.  Left Ear: External ear normal.  Nose: Nose normal.  Mouth/Throat: Oropharynx is clear and moist.  Eyes: Conjunctivae and EOM are normal. Pupils are equal, round, and reactive to light. Right eye exhibits no discharge.  Neck: Normal range of motion. Neck supple. No tracheal deviation present. No thyromegaly present.  Cardiovascular: Normal rate, regular rhythm, normal heart sounds and intact distal  pulses.   No murmur heard. Pulmonary/Chest: Effort normal and breath sounds normal. No respiratory distress. He has no wheezes. He has no rales. He exhibits no tenderness.  Abdominal: Soft. He exhibits no distension and no mass. There is no tenderness. There is no rebound and no guarding.  Musculoskeletal: Normal range of motion.  Lymphadenopathy:    He has no cervical adenopathy.  Neurological: He is alert and oriented to person, place, and time. He has normal reflexes.  Skin: Skin is warm and dry. There is erythema (on arms after seeing Dermatology.  ).  Psychiatric: He has a normal mood and affect. His behavior is normal. Judgment and thought content normal.    Activities of Daily Living In your present state of health, do you have any difficulty performing the following activities: 06/15/2015  Hearing? N  Vision? N  Difficulty concentrating or making decisions? N  Walking or climbing stairs? N  Dressing or bathing? N  Doing errands, shopping? N    Fall Risk Assessment Fall Risk  06/15/2015 06/15/2015  Falls in the past year? No No     Depression Screen PHQ 2/9 Scores 06/15/2015 06/15/2015  PHQ - 2 Score 0 0    Cognitive Testing - 6-CIT  Correct? Score   What year is it? yes 0 0 or 4  What month is it? yes 0 0 or 3  Memorize:    Pia Mau,  43,  [address],  [place],      What time is it? (within 1 hour) yes 0 0 or 3  Count backwards from 20 yes 0 0, 2, or 4  Name the months of the year yes 0 0, 2, or 4  Repeat name & address above no 4 0, 2, 4, 6, 8, or 10       TOTAL SCORE  4/28   Interpretation:  Normal  Normal (0-7) Abnormal (8-28)       Assessment & Plan:     Annual Wellness Visit  Reviewed patient's Family Medical History Reviewed and updated list of patient's medical providers Assessment of cognitive impairment was done Assessed patient's functional ability Established a written schedule for health screening [place] Completed  and Reviewed  Exercise Activities and Dietary recommendations Goals    None      There is no immunization history for the selected administration types on file for this patient.  Health Maintenance  Topic Date Due  . Hepatitis C Screening  13-Jun-1946  . TETANUS/TDAP  01/13/1966  . ZOSTAVAX  01/14/2007  . PNA vac Low Risk Adult (1 of 2 - PCV13) 01/14/2012  . INFLUENZA VACCINE  02/06/2015  . COLONOSCOPY  02/10/2024      Discussed health benefits of physical activity, and encouraged him to engage in regular exercise appropriate for his age and condition.   1. Medicare annual wellness visit, subsequent Stable as above. Will check labs.   - CBC with Differential/Platelet - Comprehensive metabolic panel - Hemoglobin A1c - Lipid  panel - TSH  2. Flu vaccine need Given today.  - Flu vaccine HIGH DOSE PF (Fluzone High dose)  3. Family history of malignant neoplasm of prostate Check labs.  - PSA  4. Asymptomatic PVCs  5. Disorder of mitral valve Improved status post surgery.   Patient was seen and examined by Jerrell Belfast, MD, and note scribed by Ashley Royalty, CMA.   I have reviewed the document for accuracy and completeness and I agree with above. Jerrell Belfast, MD   Margarita Rana, MD     ------------------------------------------------------------------------------------------------------------

## 2015-06-19 ENCOUNTER — Telehealth: Payer: Self-pay | Admitting: Family Medicine

## 2015-06-19 DIAGNOSIS — Z0000 Encounter for general adult medical examination without abnormal findings: Secondary | ICD-10-CM | POA: Diagnosis not present

## 2015-06-19 DIAGNOSIS — I059 Rheumatic mitral valve disease, unspecified: Secondary | ICD-10-CM | POA: Diagnosis not present

## 2015-06-19 DIAGNOSIS — Z8042 Family history of malignant neoplasm of prostate: Secondary | ICD-10-CM | POA: Diagnosis not present

## 2015-06-19 DIAGNOSIS — I493 Ventricular premature depolarization: Secondary | ICD-10-CM | POA: Diagnosis not present

## 2015-06-19 DIAGNOSIS — Z23 Encounter for immunization: Secondary | ICD-10-CM | POA: Diagnosis not present

## 2015-06-19 NOTE — Telephone Encounter (Signed)
That is fine 

## 2015-06-19 NOTE — Telephone Encounter (Signed)
This patient wife Scott Potts was to become a new patient with you. I told her I had to ask you first.  Cb# [phone]

## 2015-06-20 ENCOUNTER — Telehealth: Payer: Self-pay

## 2015-06-20 LAB — COMPREHENSIVE METABOLIC PANEL
ALT: 20 IU/L (ref 0–44)
AST: 23 IU/L (ref 0–40)
Albumin/Globulin Ratio: 2 (ref 1.1–2.5)
Albumin: 4.2 g/dL (ref 3.6–4.8)
Alkaline Phosphatase: 54 IU/L (ref 39–117)
BUN / CREAT RATIO: 15 (ref 10–22)
BUN: 13 mg/dL (ref 8–27)
Bilirubin Total: 0.9 mg/dL (ref 0.0–1.2)
CALCIUM: 9.3 mg/dL (ref 8.6–10.2)
CO2: 25 mmol/L (ref 18–29)
CREATININE: 0.89 mg/dL (ref 0.76–1.27)
Chloride: 102 mmol/L (ref 96–106)
GFR calc Af Amer: 102 mL/min/{1.73_m2} (ref >59)
GFR, EST NON AFRICAN AMERICAN: 88 mL/min/{1.73_m2} (ref >59)
Globulin, Total: 2.1 g/dL (ref 1.5–4.5)
Glucose: 91 mg/dL (ref 65–99)
POTASSIUM: 4.6 mmol/L (ref 3.5–5.2)
SODIUM: 140 mmol/L (ref 134–144)
Total Protein: 6.3 g/dL (ref 6.0–8.5)

## 2015-06-20 LAB — CBC WITH DIFFERENTIAL/PLATELET
BASOS ABS: 0.1 10*3/uL (ref 0.0–0.2)
Basos: 1 %
EOS (ABSOLUTE): 0.4 10*3/uL (ref 0.0–0.4)
Eos: 8 %
Hematocrit: 38.8 % (ref 37.5–51.0)
Hemoglobin: 13.2 g/dL (ref 12.6–17.7)
IMMATURE GRANULOCYTES: 0 %
Immature Grans (Abs): 0 10*3/uL (ref 0.0–0.1)
Lymphocytes Absolute: 1.6 10*3/uL (ref 0.7–3.1)
Lymphs: 31 %
MCH: 29.7 pg (ref 26.6–33.0)
MCHC: 34 g/dL (ref 31.5–35.7)
MCV: 87 fL (ref 79–97)
MONOS ABS: 0.6 10*3/uL (ref 0.1–0.9)
Monocytes: 12 %
NEUTROS PCT: 48 %
Neutrophils Absolute: 2.5 10*3/uL (ref 1.4–7.0)
PLATELETS: 260 10*3/uL (ref 150–379)
RBC: 4.45 x10E6/uL (ref 4.14–5.80)
RDW: 13.2 % (ref 12.3–15.4)
WBC: 5.2 10*3/uL (ref 3.4–10.8)

## 2015-06-20 LAB — LIPID PANEL
CHOL/HDL RATIO: 2.9 ratio (ref 0.0–5.0)
Cholesterol, Total: 170 mg/dL (ref 100–199)
HDL: 58 mg/dL (ref >39)
LDL Calculated: 99 mg/dL (ref 0–99)
Triglycerides: 63 mg/dL (ref 0–149)
VLDL CHOLESTEROL CAL: 13 mg/dL (ref 5–40)

## 2015-06-20 LAB — TSH: TSH: 3.24 u[IU]/mL (ref 0.450–4.500)

## 2015-06-20 LAB — PSA: PROSTATE SPECIFIC AG, SERUM: 2.7 ng/mL (ref 0.0–4.0)

## 2015-06-20 LAB — HEMOGLOBIN A1C
ESTIMATED AVERAGE GLUCOSE: 120 mg/dL
HEMOGLOBIN A1C: 5.8 % — AB (ref 4.8–5.6)

## 2015-06-20 NOTE — Telephone Encounter (Signed)
Called pt Scott Potts. Was going to offer New pt appt with Dr. Venia Minks for 08/07/15 @ 3 pm. Thanks TNP

## 2015-06-20 NOTE — Telephone Encounter (Signed)
Spoke with pt and his wife. Scheduled appt for Friday 06/23/15. Thanks TNP

## 2015-06-20 NOTE — Telephone Encounter (Signed)
-----   Message from Margarita Rana, MD sent at 06/20/2015  7:37 AM EST ----- Labs stable. PLease notify patient. Blood sugar is at upper limits of normal, so make sure to continue to eat healthy and exercise.  Thanks.

## 2015-06-20 NOTE — Telephone Encounter (Signed)
LMTCB 06/20/2015  Thanks,   -Mickel Baas

## 2015-06-21 NOTE — Telephone Encounter (Signed)
Patient advised as directed below. Patient verbalized understanding.  

## 2015-07-31 DIAGNOSIS — G40909 Epilepsy, unspecified, not intractable, without status epilepticus: Secondary | ICD-10-CM | POA: Diagnosis not present

## 2015-10-30 ENCOUNTER — Ambulatory Visit (INDEPENDENT_AMBULATORY_CARE_PROVIDER_SITE_OTHER): Payer: Medicare PPO | Admitting: Cardiology

## 2015-10-30 ENCOUNTER — Encounter: Payer: Self-pay | Admitting: Cardiology

## 2015-10-30 VITALS — BP 120/82 | HR 77 | Ht 73.0 in | Wt 192.2 lb

## 2015-10-30 DIAGNOSIS — Z9889 Other specified postprocedural states: Secondary | ICD-10-CM | POA: Diagnosis not present

## 2015-10-30 DIAGNOSIS — I059 Rheumatic mitral valve disease, unspecified: Secondary | ICD-10-CM | POA: Diagnosis not present

## 2015-10-30 NOTE — Progress Notes (Signed)
Patient ID: [name], male   DOB: 09-20-1945, 70 y.o.   MRN: [id number]      Cardiology Office Note   Date:  10/30/2015   ID:  Scott Potts, DOB Aug 28, 1945, MRN [id number]  PCP:  Margarita Rana, MD  Cardiologist:   Dorothy Spark, MD   Chief Complaint  Patient presents with  . Follow-up   History of Present Illness: [name] is a 70 y.o. male who presents for two-year follow-up, this is very active Younger appearing gentleman who underwent mitral valve repair with annuloplasty ring in 2008 for severe mitral valve prolapse. The patient remains very active, walks 5-8 miles a day, and plays golf 5 times a week. With all that activity he denies any shortness of breath, dyspnea on exertion, chest pain, no dizziness palpitations or syncope, no lower extremity edema or claudications.  Past Medical History  Diagnosis Date  . History of vein stripping   . Kidney stones   . Thrombophlebitis of lower extremities     left  . Mitral valve regurgitation   . History of nephrolithiasis   . Seizure (Scott Potts)     6 months ago while sleeping   Past Surgical History  Procedure Laterality Date  . Varicose vein surgery      vein strip left leg  . Cardiac catheterization  01/05/08  . Endocardiogram  2009  . Heart valve repair  2009  . Colonoscopy  2005   Current Outpatient Prescriptions  Medication Sig Dispense Refill  . levETIRAcetam (KEPPRA) 500 MG tablet Take 1 tablet by mouth 2 (two) times daily.    . Multiple Vitamin (MULTIVITAMIN) tablet Take 1 tablet by mouth daily.       No current facility-administered medications for this visit.   Allergies:   Penicillins   Social History:  The patient  reports that he has quit smoking. He does not have any smokeless tobacco history on file. He reports that he drinks alcohol. He reports that he does not use illicit drugs.   Family History:  The patient's family history is negative for Coronary artery disease, Diabetes, and Hypertension.     ROS:  Please see the history of present illness.   Otherwise, review of systems are positive for none.   All other systems are reviewed and negative.    PHYSICAL EXAM: VS:  BP 120/82 mmHg  Pulse 77  Ht 6\' 1"  (1.854 m)  Wt 192 lb 3.2 oz (87.181 kg)  BMI 25.36 kg/m2 , BMI Body mass index is 25.36 kg/(m^2). GEN: Well nourished, well developed, in no acute distress HEENT: normal Neck: no JVD, carotid bruits, or masses Cardiac: RRR; no murmurs, rubs, or gallops,no edema  Respiratory:  clear to auscultation bilaterally, normal work of breathing GI: soft, nontender, nondistended, + BS MS: no deformity or atrophy Skin: warm and dry, no rash Neuro:  Strength and sensation are intact Psych: euthymic mood, full affect   EKG:  EKG is ordered today. The ekg ordered today demonstrates SR, incomplete examination mental left axis deviation, unchanged from 10/22/2013.  Recent Labs: 06/19/2015: ALT 20; BUN 13; Creatinine, Ser 0.89; Platelets 260; Potassium 4.6; Sodium 140; TSH 3.240   Lipid Panel    Component Value Date/Time   CHOL 170 06/19/2015 0810   TRIG 63 06/19/2015 0810   HDL 58 06/19/2015 0810   CHOLHDL 2.9 06/19/2015 0810   LDLCALC 99 06/19/2015 0810   Wt Readings from Last 3 Encounters:  10/30/15 192 lb 3.2 oz (87.181  kg)  06/15/15 192 lb (87.091 kg)  01/13/14 187 lb (84.823 kg)    Other studies Reviewed:  TTE: 11/04/2013 Left ventricle: The cavity size was normal. Systolic function was normal. The estimated ejection fraction was in the range of 55% to 60%. Wall motion was normal; there were no regional wall motion abnormalities. Previous mitral surgery limits evaluation of LV diastolic function. Mildly reduced diastolic annular velocities. - Mitral valve: Prior procedures included surgical repair. An annular ring prosthesis was present. Mean gradient: 60mm Hg (D). Peak gradient: 48mm Hg (D). Valve area by pressure half-time: 2.2cm^2. Valve area by  continuity equation (using LVOT flow): 2.17cm^2. - Left atrium: The atrium was mildly dilated.    ASSESSMENT AND PLAN:  1. MVP, s/p annuloplasty ring in 2008 - stable, asymptomatic, echo in 2015 showed normal LVEF, normally working mitral valve normal transmitral gradients. He has no new symptoms therefore we don't need to repeat echocardiogram at this point.  2. EKG performed today shows normal sinus rhythm with incomplete right buttocks [facility] and left anterior fascicular block that is unchanged from prior, he is completely asymptomatic and no ischemic workup needs to be done.  3. Lipids - all within normal records he doesn't need to be on any medication.  Follow-up in one year  Signed, Dorothy Spark, MD  10/30/2015 8:11 AM    [facility] [place], [place],   [address] Phone: [phone]; Fax: [phone]

## 2015-10-30 NOTE — Patient Instructions (Signed)

## 2015-12-02 ENCOUNTER — Other Ambulatory Visit: Payer: Self-pay | Admitting: Family Medicine

## 2016-06-03 DIAGNOSIS — D1800 Hemangioma unspecified site: Secondary | ICD-10-CM | POA: Diagnosis not present

## 2016-06-03 DIAGNOSIS — Z1283 Encounter for screening for malignant neoplasm of skin: Secondary | ICD-10-CM | POA: Diagnosis not present

## 2016-06-03 DIAGNOSIS — L570 Actinic keratosis: Secondary | ICD-10-CM | POA: Diagnosis not present

## 2016-06-03 DIAGNOSIS — L578 Other skin changes due to chronic exposure to nonionizing radiation: Secondary | ICD-10-CM | POA: Diagnosis not present

## 2016-06-03 DIAGNOSIS — L814 Other melanin hyperpigmentation: Secondary | ICD-10-CM | POA: Diagnosis not present

## 2016-06-03 DIAGNOSIS — D179 Benign lipomatous neoplasm, unspecified: Secondary | ICD-10-CM | POA: Diagnosis not present

## 2016-06-03 DIAGNOSIS — L821 Other seborrheic keratosis: Secondary | ICD-10-CM | POA: Diagnosis not present

## 2016-06-17 ENCOUNTER — Encounter: Payer: Medicare PPO | Admitting: Family Medicine

## 2016-06-17 ENCOUNTER — Ambulatory Visit (INDEPENDENT_AMBULATORY_CARE_PROVIDER_SITE_OTHER): Payer: Medicare PPO | Admitting: Family Medicine

## 2016-06-17 ENCOUNTER — Encounter: Payer: Self-pay | Admitting: Family Medicine

## 2016-06-17 VITALS — BP 114/88 | HR 73 | Temp 97.8°F | Resp 16 | Ht 71.0 in | Wt 189.8 lb

## 2016-06-17 DIAGNOSIS — I059 Rheumatic mitral valve disease, unspecified: Secondary | ICD-10-CM

## 2016-06-17 DIAGNOSIS — R569 Unspecified convulsions: Secondary | ICD-10-CM | POA: Diagnosis not present

## 2016-06-17 DIAGNOSIS — Z1211 Encounter for screening for malignant neoplasm of colon: Secondary | ICD-10-CM

## 2016-06-17 DIAGNOSIS — Z0000 Encounter for general adult medical examination without abnormal findings: Secondary | ICD-10-CM | POA: Diagnosis not present

## 2016-06-17 DIAGNOSIS — Z23 Encounter for immunization: Secondary | ICD-10-CM | POA: Diagnosis not present

## 2016-06-17 DIAGNOSIS — Z8042 Family history of malignant neoplasm of prostate: Secondary | ICD-10-CM

## 2016-06-17 LAB — IFOBT (OCCULT BLOOD): IFOBT: NEGATIVE

## 2016-06-17 NOTE — Addendum Note (Signed)
Addended by: Jules Schick on: 06/17/2016 01:32 PM   Modules accepted: Orders

## 2016-06-17 NOTE — Progress Notes (Signed)
Patient: Scott Potts, Male    DOB: 1945-10-15, 70 y.o.   MRN: [id number] Visit Date: 06/17/2016  Today's Provider: Vernie Murders, PA   Chief Complaint  Patient presents with  . Medicare Wellness   Subjective:    Annual wellness visit Scott Potts is a 70 y.o. male who presents today for his Subsequent Annual Wellness Visit. He feels well. He reports exercising 7 days per week playing golf and going to the gym. He reports he is sleeping well.  -----------------------------------------------------------   Review of Systems  Constitutional: Negative.   HENT: Positive for tinnitus.   Eyes: Negative.   Respiratory: Negative.   Cardiovascular: Negative.   Gastrointestinal: Negative.   Endocrine: Negative.   Genitourinary: Negative.   Musculoskeletal: Negative.   Skin: Negative.   Allergic/Immunologic: Negative.   Neurological: Positive for seizures.  Hematological: Negative.   Psychiatric/Behavioral: Negative.     Social History   Social History  . Marital status: Married    Spouse name: Rodena Piety  . Number of children: 2  . Years of education: N/A   Occupational History  . Retire [employer]   Social History Main Topics  . Smoking status: Former Research scientist (life sciences)  . Smokeless tobacco: Not on file     Comment: quit 2009  . Alcohol use Yes     Comment: occasionally  . Drug use: No  . Sexual activity: Not on file   Other Topics Concern  . Not on file   Social History Narrative   Pt lives alone. Moderate amount of caffeine daily. Regular exercise.    Patient Active Problem List   Diagnosis Date Noted  . Blood in the urine 06/15/2015  . Seizure ([place]) 06/15/2015  . Family history of malignant neoplasm of prostate 03/27/2015  . Asymptomatic PVCs 10/16/2011  . Mitral valve disorder 05/08/2009  . ED (erectile dysfunction) of organic origin 03/03/2009  . Disorder of mitral valve 11/23/2007  . Current tobacco use 11/20/2006  . Calculus of kidney 07/08/2000    Past Surgical  History:  Procedure Laterality Date  . CARDIAC CATHETERIZATION  01/05/08  . COLONOSCOPY  2005  . endocardiogram  2009  . heart valve repair  2009  . VARICOSE VEIN SURGERY     vein strip left leg    His family history is not on file.     Previous Medications   LEVETIRACETAM (KEPPRA) 500 MG TABLET    Take 1 tablet by mouth 2 (two) times daily.   MULTIPLE VITAMIN (MULTIVITAMIN) TABLET    Take 1 tablet by mouth daily.     SILDENAFIL (REVATIO) 20 MG TABLET    TAKE 5 TABLETS EVERY DAY AS NEEDED    Patient Care Team: Margo Common, PA as PCP - General (Family Medicine) Margarita Rana, MD as Referring Physician (Family Medicine) Seeplaputhur Robinette Haines, MD (General Surgery)      Objective:   Vitals: BP 114/88 (BP Location: Right Arm, Patient Position: Sitting, Cuff Size: Normal)   Pulse 73   Temp 97.8 F (36.6 C) (Oral)   Resp 16   Ht 5\' 11"  (1.803 m)   Wt 189 lb 12.8 oz (86.1 kg)   BMI 26.47 kg/m   Physical Exam  Constitutional: He is oriented to person, place, and time. He appears well-developed and well-nourished.  HENT:  Head: Normocephalic and atraumatic.  Right Ear: External ear normal.  Left Ear: External ear normal.  Nose: Nose normal.  Mouth/Throat: Oropharynx is clear and moist.  Eyes: Conjunctivae and EOM are  normal. Pupils are equal, round, and reactive to light. Right eye exhibits no discharge.  Neck: Normal range of motion. Neck supple. No tracheal deviation present. No thyromegaly present.  Cardiovascular: Normal rate, regular rhythm, normal heart sounds and intact distal pulses.   No murmur heard. Pulmonary/Chest: Effort normal and breath sounds normal. No respiratory distress. He has no wheezes. He has no rales. He exhibits no tenderness.  Abdominal: Soft. He exhibits no distension and no mass. There is no tenderness. There is no rebound and no guarding.  Genitourinary: Penis normal.  Genitourinary Comments: Smooth enlargement of prostate - no nodules.   Musculoskeletal: Normal range of motion. He exhibits no edema or tenderness.  Lymphadenopathy:    He has no cervical adenopathy.  Neurological: He is alert and oriented to person, place, and time. He has normal reflexes. No cranial nerve deficit. He exhibits normal muscle tone. Coordination normal.  Skin: Skin is warm and dry. No rash noted. No erythema.  Psychiatric: He has a normal mood and affect. His behavior is normal. Judgment and thought content normal.    Activities of Daily Living In your present state of health, do you have any difficulty performing the following activities: 06/17/2016  Hearing? N  Vision? N  Difficulty concentrating or making decisions? N  Walking or climbing stairs? N  Dressing or bathing? N  Doing errands, shopping? N  Some recent data might be hidden    Fall Risk Assessment Fall Risk  06/17/2016 06/15/2015 06/15/2015  Falls in the past year? No No No     Depression Screen PHQ 2/9 Scores 06/17/2016 06/15/2015 06/15/2015  PHQ - 2 Score 0 0 0    Cognitive Testing - 6-CIT  Correct? Score   What year is it? yes 0 0 or 4  What month is it? yes 0 0 or 3  Memorize:    Scott Potts,  43,  [place],      What time is it? (within 1 hour) yes 0 0 or 3  Count backwards from 20 yes 0 0, 2, or 4  Name the months of the year yes 2 0, 2, or 4  Repeat name & address above yes 6 0, 2, 4, 6, 8, or 10       TOTAL SCORE  8/28   Interpretation:  Abnormal- 8  Normal (0-7) Abnormal (8-28)    Audit-C Alcohol Use Screening  Question Answer Points  How often do you have alcoholic drink? 2-4 times monthly 2  On days you do drink alcohol, how many drinks do you typically consume? 1 0  How oftey will you drink 6 or more in a total? never 0  Total Score:  2   A score of 3 or more in women, and 4 or more in men indicates increased risk for alcohol abuse, EXCEPT if all of the points are from question 1.     Assessment & Plan:     Annual Wellness  Visit  Reviewed patient's Family Medical History Reviewed and updated list of patient's medical providers Assessment of cognitive impairment was done Assessed patient's functional ability Established a written schedule for health screening [place] Completed and Reviewed  Exercise Activities and Dietary recommendations Goals    Continue to walk daily and play golf for exercise.      Immunization History  Administered Date(s) Administered  . Influenza, High Dose Seasonal PF 03/31/2014, 06/19/2015, 06/17/2016  . Pneumococcal Conjugate-13 12/13/2013  . Pneumococcal Polysaccharide-23 12/09/2012  .  Tdap 11/05/2005, 11/25/2008    Health Maintenance  Topic Date Due  . ZOSTAVAX  01/14/2007  . TETANUS/TDAP  11/26/2018  . COLONOSCOPY  02/10/2024  . INFLUENZA VACCINE  Completed  . Hepatitis C Screening  Completed  . PNA vac Low Risk Adult  Completed     Discussed health benefits of physical activity, and encouraged him to engage in regular exercise appropriate for his age and condition.    ------------------------------------------------------------------------------------------------------------ 1. Encounter for Medicare annual wellness exam Good general health and immunizations up to date. Refused Zostavax. Will up date flu shot today. Check routine labs. Given anticipatory guidance. - CBC with Differential/Platelet - Comprehensive metabolic panel - Lipid panel  2. Seizure [facility]) Presently on Keppra daily and no recent seizure activity. Followed by Dr. Manuella Ghazi (neurologist) annually. Recheck routine labs and follow up pending report. - CBC with Differential/Platelet - Comprehensive metabolic panel - Hemoglobin A1c - TSH  3. Family history of malignant neoplasm of prostate Older brother has prostate cancer. Some enlargement of gland today. Will recheck PSA. - PSA  4. Disorder of mitral valve History of mitral valve repair by Dr. Roxy Manns in 2009. No murmur  or chest discomfort. Recheck labs. - Lipid panel - TSH  5. Need for influenza vaccination - Flu vaccine HIGH DOSE PF

## 2016-06-18 LAB — COMPREHENSIVE METABOLIC PANEL
A/G RATIO: 2.2 (ref 1.2–2.2)
ALT: 21 IU/L (ref 0–44)
AST: 19 IU/L (ref 0–40)
Albumin: 4.7 g/dL (ref 3.6–4.8)
Alkaline Phosphatase: 54 IU/L (ref 39–117)
BUN / CREAT RATIO: 17 (ref 10–24)
BUN: 17 mg/dL (ref 8–27)
Bilirubin Total: 0.6 mg/dL (ref 0.0–1.2)
CALCIUM: 9.7 mg/dL (ref 8.6–10.2)
CO2: 27 mmol/L (ref 18–29)
Chloride: 103 mmol/L (ref 96–106)
Creatinine, Ser: 0.98 mg/dL (ref 0.76–1.27)
GFR, EST AFRICAN AMERICAN: 91 mL/min/{1.73_m2} (ref >59)
GFR, EST NON AFRICAN AMERICAN: 78 mL/min/{1.73_m2} (ref >59)
GLOBULIN, TOTAL: 2.1 g/dL (ref 1.5–4.5)
Glucose: 97 mg/dL (ref 65–99)
POTASSIUM: 5.2 mmol/L (ref 3.5–5.2)
SODIUM: 142 mmol/L (ref 134–144)
TOTAL PROTEIN: 6.8 g/dL (ref 6.0–8.5)

## 2016-06-18 LAB — LIPID PANEL
CHOL/HDL RATIO: 3.4 ratio (ref 0.0–5.0)
Cholesterol, Total: 191 mg/dL (ref 100–199)
HDL: 56 mg/dL (ref >39)
LDL CALC: 112 mg/dL — AB (ref 0–99)
Triglycerides: 115 mg/dL (ref 0–149)
VLDL CHOLESTEROL CAL: 23 mg/dL (ref 5–40)

## 2016-06-18 LAB — CBC WITH DIFFERENTIAL/PLATELET
BASOS: 1 %
Basophils Absolute: 0.1 10*3/uL (ref 0.0–0.2)
EOS (ABSOLUTE): 0.3 10*3/uL (ref 0.0–0.4)
EOS: 4 %
HEMATOCRIT: 41.2 % (ref 37.5–51.0)
Hemoglobin: 13.7 g/dL (ref 13.0–17.7)
IMMATURE GRANS (ABS): 0 10*3/uL (ref 0.0–0.1)
IMMATURE GRANULOCYTES: 0 %
Lymphocytes Absolute: 2 10*3/uL (ref 0.7–3.1)
Lymphs: 28 %
MCH: 29.1 pg (ref 26.6–33.0)
MCHC: 33.3 g/dL (ref 31.5–35.7)
MCV: 88 fL (ref 79–97)
MONOS ABS: 0.8 10*3/uL (ref 0.1–0.9)
Monocytes: 11 %
NEUTROS ABS: 4.1 10*3/uL (ref 1.4–7.0)
NEUTROS PCT: 56 %
Platelets: 277 10*3/uL (ref 150–379)
RBC: 4.7 x10E6/uL (ref 4.14–5.80)
RDW: 14.2 % (ref 12.3–15.4)
WBC: 7.2 10*3/uL (ref 3.4–10.8)

## 2016-06-18 LAB — PSA: PROSTATE SPECIFIC AG, SERUM: 2.8 ng/mL (ref 0.0–4.0)

## 2016-06-18 LAB — HEMOGLOBIN A1C
Est. average glucose Bld gHb Est-mCnc: 114 mg/dL
Hgb A1c MFr Bld: 5.6 % (ref 4.8–5.6)

## 2016-06-18 LAB — TSH: TSH: 4.19 u[IU]/mL (ref 0.450–4.500)

## 2016-06-19 ENCOUNTER — Telehealth: Payer: Self-pay

## 2016-06-19 NOTE — Telephone Encounter (Signed)
[id number] 

## 2016-06-19 NOTE — Telephone Encounter (Signed)
-----   Message from Margo Common, [place] sent at 06/18/2016  5:09 PM EST ----- Normal blood tests with only slight elevation of LDL. Blood sugar and Hgb A1C is in the normal non-diabetic range. Recommend exercise and low fat diet with plenty of water in diet. Recheck annually.

## 2016-06-19 NOTE — Telephone Encounter (Signed)
Patient has been advised. KW 

## 2016-07-20 ENCOUNTER — Other Ambulatory Visit: Payer: Self-pay | Admitting: Family Medicine

## 2016-07-20 NOTE — Telephone Encounter (Signed)
Last ov 06/17/16 last filled 12/05/15. Please review. Thank you. sd

## 2016-07-29 DIAGNOSIS — G40909 Epilepsy, unspecified, not intractable, without status epilepticus: Secondary | ICD-10-CM | POA: Diagnosis not present

## 2016-08-19 DIAGNOSIS — D171 Benign lipomatous neoplasm of skin and subcutaneous tissue of trunk: Secondary | ICD-10-CM | POA: Diagnosis not present

## 2016-08-19 DIAGNOSIS — D485 Neoplasm of uncertain behavior of skin: Secondary | ICD-10-CM | POA: Diagnosis not present

## 2016-08-26 DIAGNOSIS — L820 Inflamed seborrheic keratosis: Secondary | ICD-10-CM | POA: Diagnosis not present

## 2016-12-13 ENCOUNTER — Encounter: Payer: Self-pay | Admitting: Cardiology

## 2016-12-19 ENCOUNTER — Encounter: Payer: Self-pay | Admitting: Cardiovascular Disease

## 2016-12-19 ENCOUNTER — Ambulatory Visit
Admission: RE | Admit: 2016-12-19 | Discharge: 2016-12-19 | Disposition: A | Discharge status: Home / Self Care | Payer: Medicare PPO | Source: Ambulatory Visit | Attending: Cardiovascular Disease | Admitting: Cardiovascular Disease

## 2016-12-19 ENCOUNTER — Encounter (INDEPENDENT_AMBULATORY_CARE_PROVIDER_SITE_OTHER): Payer: Self-pay

## 2016-12-19 ENCOUNTER — Ambulatory Visit (INDEPENDENT_AMBULATORY_CARE_PROVIDER_SITE_OTHER): Payer: Medicare PPO | Admitting: Cardiovascular Disease

## 2016-12-19 VITALS — BP 126/80 | HR 70 | Ht 73.0 in | Wt 184.0 lb

## 2016-12-19 DIAGNOSIS — I059 Rheumatic mitral valve disease, unspecified: Secondary | ICD-10-CM | POA: Diagnosis not present

## 2016-12-19 DIAGNOSIS — R918 Other nonspecific abnormal finding of lung field: Secondary | ICD-10-CM | POA: Diagnosis not present

## 2016-12-19 NOTE — Progress Notes (Signed)
Patient ID: STONE SPIRITO, male   DOB: 1945-11-07, 71 y.o.   MRN: [id number]      Cardiology Office Note   Date:  12/19/2016   ID:  Scott Potts, DOB 05-May-1946, MRN [id number]  PCP:  Margo Common, PA  Cardiologist:   Jenkins Rouge, MD   Chief Complaint  Patient presents with  . Mitral Valve Disorder   History of Present Illness: Scott Potts is a 71 y.o. male who presents for one -year follow-up,  He is very active gentleman who underwent mitral valve repair with annuloplasty ring in 02/18/2008  for severe mitral valve prolapse. The patient remains very active, walks 5-8 miles a day, and plays golf 5 times a week. With all that activity he denies any shortness of breath, dyspnea on exertion, chest pain, no dizziness palpitations or syncope, no lower extremity edema or claudications.  Reviewed operative note  Flail P2 posterior leaflet MVP no residual post repair Dr Roxy Manns  PROCEDURE:  Right miniature thoracotomy for mitral valve repair  (triangular resection of posterior leaflet with 30-mm Edwards Physio II  ring annuloplasty).  He is retired from [employer] Despite stature and MVP no Dx of [place] Has two older daughters with two grand kids   Past Medical History:  Diagnosis Date  . History of nephrolithiasis   . History of vein stripping   . Kidney stones   . Mitral valve regurgitation   . Seizure ([place])    6 months ago while sleeping  . Thrombophlebitis of lower extremities    left   Past Surgical History:  Procedure Laterality Date  . CARDIAC CATHETERIZATION  01/05/08  . COLONOSCOPY  2005  . endocardiogram  2009  . heart valve repair  2009  . VARICOSE VEIN SURGERY     vein strip left leg   Current Outpatient Prescriptions  Medication Sig Dispense Refill  . levETIRAcetam (KEPPRA) 500 MG tablet Take 1 tablet by mouth 2 (two) times daily.    . Multiple Vitamin (MULTIVITAMIN) tablet Take 1 tablet by mouth daily.       No current facility-administered  medications for this visit.    Allergies:   Penicillins   Social History:  The patient  reports that he has quit smoking. He has never used smokeless tobacco. He reports that he drinks alcohol. He reports that he does not use drugs.   Family History:  The patient's family history is not on file.    ROS:  Please see the history of present illness.   Otherwise, review of systems are positive for none.   All other systems are reviewed and negative.    PHYSICAL EXAM: VS:  BP 126/80   Pulse 70   Ht 6\' 1"  (1.854 m)   Wt 83.5 kg (184 lb)   SpO2 98%   BMI 24.28 kg/m  , BMI Body mass index is 24.28 kg/m. GEN: Well nourished, well developed, in no acute distress HEENT: normal Neck: no JVD, carotid bruits, or masses Cardiac: RRR; no murmurs, rubs, or gallops,no edema  Respiratory:  clear to auscultation bilaterally, normal work of breathing GI: soft, nontender, nondistended, + BS MS: no deformity or atrophy Skin: warm and dry, no rash Neuro:  Strength and sensation are intact Psych: euthymic mood, full affect   EKG:  EKG is ordered today. The ekg ordered today demonstrates SR, incomplete examination mental left axis deviation, unchanged from 10/22/2013. 12/19/16  SR rate 66 PR 218 ICRBBB LAFB   Recent Labs:  06/17/2016: ALT 21; BUN 17; Creatinine, Ser 0.98; Hemoglobin 13.7; Platelets 277; Potassium 5.2; Sodium 142; TSH 4.190   Lipid Panel    Component Value Date/Time   CHOL 191 06/17/2016 1152   TRIG 115 06/17/2016 1152   HDL 56 06/17/2016 1152   CHOLHDL 3.4 06/17/2016 1152   LDLCALC 112 (H) 06/17/2016 1152   Wt Readings from Last 3 Encounters:  12/19/16 83.5 kg (184 lb)  06/17/16 86.1 kg (189 lb 12.8 oz)  10/30/15 87.2 kg (192 lb 3.2 oz)    Other studies Reviewed:  TTE: 11/04/2013 Left ventricle: The cavity size was normal. Systolic function was normal. The estimated ejection fraction was in the range of 55% to 60%. Wall motion was normal; there were no regional  wall motion abnormalities. Previous mitral surgery limits evaluation of LV diastolic function. Mildly reduced diastolic annular velocities. - Mitral valve: Prior procedures included surgical repair. An annular ring prosthesis was present. Mean gradient: 81mm Hg (D). Peak gradient: 13mm Hg (D). Valve area by pressure half-time: 2.2cm^2. Valve area by continuity equation (using LVOT flow): 2.17cm^2. - Left atrium: The atrium was mildly dilated.    ASSESSMENT AND PLAN:  1. MVP, s/p annuloplasty ring in 2008 - stable, asymptomatic, echo in 2015 showed normal LVEF, normally working mitral valve normal transmitral gradients. He has no new symptoms therefore we don't need to repeat echocardiogram at this point. CXR to assess aortic root and screen mediastinum   2. EKG Bifascicular block stable on ECG today yearly ECG no high grade block   3. Lipids - all within normal records he doesn't need to be on any medication.  Follow-up in one year at [place], Jenkins Rouge, MD  12/19/2016 8:59 AM    [facility] [place], [place], [place]  [address] Phone: [phone]; Fax: [phone]

## 2016-12-19 NOTE — Patient Instructions (Addendum)
Medication Instructions:  Your physician recommends that you continue on your current medications as directed. Please refer to the Current Medication list given to you today.  Labwork: NONE  Testing/Procedures: A chest x-ray takes a picture of the organs and structures inside the chest, including the heart, lungs, and blood vessels. This test can show several things, including, whether the heart is enlarges; whether fluid is building up in the lungs; and whether pacemaker / defibrillator leads are still in place. Please go to [facility]- at  [address] [place], [place]  Follow-Up: Your physician wants you to follow-up in: 12 months with Dr. Saunders Revel in [place]. You will receive a reminder letter in the mail two months in advance. If you don't receive a letter, please call our office to schedule the follow-up appointment.   If you need a refill on your cardiac medications before your next appointment, please call your pharmacy.

## 2017-01-02 ENCOUNTER — Ambulatory Visit: Payer: Medicare PPO | Admitting: Cardiology

## 2017-02-07 ENCOUNTER — Ambulatory Visit: Payer: Medicare PPO | Admitting: Cardiovascular Disease

## 2017-05-09 ENCOUNTER — Other Ambulatory Visit: Payer: Self-pay | Admitting: Family Medicine

## 2017-06-03 DIAGNOSIS — L720 Epidermal cyst: Secondary | ICD-10-CM | POA: Diagnosis not present

## 2017-06-03 DIAGNOSIS — L570 Actinic keratosis: Secondary | ICD-10-CM | POA: Diagnosis not present

## 2017-06-03 DIAGNOSIS — L905 Scar conditions and fibrosis of skin: Secondary | ICD-10-CM | POA: Diagnosis not present

## 2017-06-03 DIAGNOSIS — L814 Other melanin hyperpigmentation: Secondary | ICD-10-CM | POA: Diagnosis not present

## 2017-06-03 DIAGNOSIS — D1800 Hemangioma unspecified site: Secondary | ICD-10-CM | POA: Diagnosis not present

## 2017-06-03 DIAGNOSIS — L578 Other skin changes due to chronic exposure to nonionizing radiation: Secondary | ICD-10-CM | POA: Diagnosis not present

## 2017-06-03 DIAGNOSIS — L821 Other seborrheic keratosis: Secondary | ICD-10-CM | POA: Diagnosis not present

## 2017-06-03 DIAGNOSIS — Z1283 Encounter for screening for malignant neoplasm of skin: Secondary | ICD-10-CM | POA: Diagnosis not present

## 2017-06-03 DIAGNOSIS — D229 Melanocytic nevi, unspecified: Secondary | ICD-10-CM | POA: Diagnosis not present

## 2017-06-20 ENCOUNTER — Encounter: Payer: Medicare PPO | Admitting: Family Medicine

## 2017-06-23 ENCOUNTER — Ambulatory Visit (INDEPENDENT_AMBULATORY_CARE_PROVIDER_SITE_OTHER): Payer: Medicare PPO | Admitting: Family Medicine

## 2017-06-23 ENCOUNTER — Ambulatory Visit (INDEPENDENT_AMBULATORY_CARE_PROVIDER_SITE_OTHER): Payer: Medicare PPO

## 2017-06-23 ENCOUNTER — Encounter: Payer: Self-pay | Admitting: Family Medicine

## 2017-06-23 VITALS — BP 118/80 | HR 76 | Temp 97.7°F | Ht 73.0 in | Wt 189.2 lb

## 2017-06-23 VITALS — BP 118/80 | HR 76 | Temp 97.7°F | Ht 73.0 in | Wt 189.0 lb

## 2017-06-23 DIAGNOSIS — Z23 Encounter for immunization: Secondary | ICD-10-CM

## 2017-06-23 DIAGNOSIS — R569 Unspecified convulsions: Secondary | ICD-10-CM | POA: Diagnosis not present

## 2017-06-23 DIAGNOSIS — E7800 Pure hypercholesterolemia, unspecified: Secondary | ICD-10-CM | POA: Diagnosis not present

## 2017-06-23 DIAGNOSIS — Z0000 Encounter for general adult medical examination without abnormal findings: Secondary | ICD-10-CM

## 2017-06-23 DIAGNOSIS — N529 Male erectile dysfunction, unspecified: Secondary | ICD-10-CM | POA: Diagnosis not present

## 2017-06-23 LAB — COMPLETE METABOLIC PANEL WITH GFR
AG RATIO: 1.9 (calc) (ref 1.0–2.5)
ALBUMIN MSPROF: 4.5 g/dL (ref 3.6–5.1)
ALT: 23 U/L (ref 9–46)
AST: 22 U/L (ref 10–35)
Alkaline phosphatase (APISO): 49 U/L (ref 40–115)
BILIRUBIN TOTAL: 0.7 mg/dL (ref 0.2–1.2)
BUN: 19 mg/dL (ref 7–25)
CHLORIDE: 103 mmol/L (ref 98–110)
CO2: 32 mmol/L (ref 20–32)
Calcium: 9.8 mg/dL (ref 8.6–10.3)
Creat: 1.03 mg/dL (ref 0.70–1.18)
GFR, EST AFRICAN AMERICAN: 85 mL/min/{1.73_m2} (ref >=60)
GFR, Est Non African American: 73 mL/min/{1.73_m2} (ref >=60)
GLOBULIN: 2.4 g/dL (ref 1.9–3.7)
GLUCOSE: 99 mg/dL (ref 65–99)
Potassium: 4.6 mmol/L (ref 3.5–5.3)
SODIUM: 140 mmol/L (ref 135–146)
TOTAL PROTEIN: 6.9 g/dL (ref 6.1–8.1)

## 2017-06-23 LAB — CBC WITH DIFFERENTIAL/PLATELET
Basophils Absolute: 61 cells/uL (ref 0–200)
Basophils Relative: 0.9 %
EOS PCT: 2.5 %
Eosinophils Absolute: 170 cells/uL (ref 15–500)
HEMATOCRIT: 40.9 % (ref 38.5–50.0)
HEMOGLOBIN: 13.9 g/dL (ref 13.2–17.1)
LYMPHS ABS: 1333 {cells}/uL (ref 850–3900)
MCH: 29.3 pg (ref 27.0–33.0)
MCHC: 34 g/dL (ref 32.0–36.0)
MCV: 86.3 fL (ref 80.0–100.0)
MPV: 11 fL (ref 7.5–12.5)
Monocytes Relative: 8.5 %
NEUTROS ABS: 4658 {cells}/uL (ref 1500–7800)
Neutrophils Relative %: 68.5 %
Platelets: 284 10*3/uL (ref 140–400)
RBC: 4.74 10*6/uL (ref 4.20–5.80)
RDW: 12.8 % (ref 11.0–15.0)
Total Lymphocyte: 19.6 %
WBC: 6.8 10*3/uL (ref 3.8–10.8)
WBCMIX: 578 {cells}/uL (ref 200–950)

## 2017-06-23 LAB — LIPID PANEL
CHOL/HDL RATIO: 3.7 (calc) (ref <5.0)
Cholesterol: 196 mg/dL (ref <200)
HDL: 53 mg/dL (ref >40)
LDL CHOLESTEROL (CALC): 121 mg/dL — AB
NON-HDL CHOLESTEROL (CALC): 143 mg/dL — AB (ref <130)
TRIGLYCERIDES: 109 mg/dL (ref <150)

## 2017-06-23 LAB — TSH: TSH: 2.55 mIU/L (ref 0.40–4.50)

## 2017-06-23 NOTE — Patient Instructions (Signed)
[name] , Thank you for taking time to come for your Medicare Wellness Visit. I appreciate your ongoing commitment to your health goals. Please review the following plan we discussed and let me know if I can assist you in the future.   Screening recommendations/referrals: Colonoscopy: up to date Recommended yearly ophthalmology/optometry visit for glaucoma screening and checkup Recommended yearly dental visit for hygiene and checkup  Vaccinations: Influenza vaccine: completed today Pneumococcal vaccine: up to date Tdap vaccine: up to date Shingles vaccine: Pt declined today.  Advanced directives: Please bring a copy of your POA (Power of Attorney) and/or Living Will to your next appointment.   Conditions/risks identified: Recommend increasing water intake to 4-6 glasses of water a day.   Next appointment: 10:00 AM today  Preventive Care 65 Years and Older, Male Preventive care refers to lifestyle choices and visits with your health care provider that can promote health and wellness. What does preventive care include?  A yearly physical exam. This is also called an annual well check.  Dental exams once or twice a year.  Routine eye exams. Ask your health care provider how often you should have your eyes checked.  Personal lifestyle choices, including:  Daily care of your teeth and gums.  Regular physical activity.  Eating a healthy diet.  Avoiding tobacco and drug use.  Limiting alcohol use.  Practicing safe sex.  Taking low doses of aspirin every day.  Taking vitamin and mineral supplements as recommended by your health care provider. What happens during an annual well check? The services and screenings done by your health care provider during your annual well check will depend on your age, overall health, lifestyle risk factors, and family history of disease. Counseling  Your health care provider may ask you questions about your:  Alcohol use.  Tobacco  use.  Drug use.  Emotional well-being.  Home and relationship well-being.  Sexual activity.  Eating habits.  History of falls.  Memory and ability to understand (cognition).  Work and work Statistician. Screening  You may have the following tests or measurements:  Height, weight, and BMI.  Blood pressure.  Lipid and cholesterol levels. These may be checked every 5 years, or more frequently if you are over 49 years old.  Skin check.  Lung cancer screening. You may have this screening every year starting at age 77 if you have a 30-pack-year history of smoking and currently smoke or have quit within the past 15 years.  Fecal occult blood test (FOBT) of the stool. You may have this test every year starting at age 20.  Flexible sigmoidoscopy or colonoscopy. You may have a sigmoidoscopy every 5 years or a colonoscopy every 10 years starting at age 2.  Prostate cancer screening. Recommendations will vary depending on your family history and other risks.  Hepatitis C blood test.  Hepatitis B blood test.  Sexually transmitted disease (STD) testing.  Diabetes screening. This is done by checking your blood sugar (glucose) after you have not eaten for a while (fasting). You may have this done every 1-3 years.  Abdominal aortic aneurysm (AAA) screening. You may need this if you are a current or former smoker.  Osteoporosis. You may be screened starting at age 19 if you are at high risk. Talk with your health care provider about your test results, treatment options, and if necessary, the need for more tests. Vaccines  Your health care provider may recommend certain vaccines, such as:  Influenza vaccine. This is recommended every  year.  Tetanus, diphtheria, and acellular pertussis (Tdap, Td) vaccine. You may need a Td booster every 10 years.  Zoster vaccine. You may need this after age 61.  Pneumococcal 13-valent conjugate (PCV13) vaccine. One dose is recommended after age  11.  Pneumococcal polysaccharide (PPSV23) vaccine. One dose is recommended after age 51. Talk to your health care provider about which screenings and vaccines you need and how often you need them. This information is not intended to replace advice given to you by your health care provider. Make sure you discuss any questions you have with your health care provider. Document Released: 07/21/2015 Document Revised: 03/13/2016 Document Reviewed: 04/25/2015 Elsevier Interactive Patient Education  2017 [place] Prevention in the Home Falls can cause injuries. They can happen to people of all ages. There are many things you can do to make your home safe and to help prevent falls. What can I do on the outside of my home?  Regularly fix the edges of walkways and driveways and fix any cracks.  Remove anything that might make you trip as you walk through a door, such as a raised step or threshold.  Trim any bushes or trees on the path to your home.  Use bright outdoor lighting.  Clear any walking paths of anything that might make someone trip, such as rocks or tools.  Regularly check to see if handrails are loose or broken. Make sure that both sides of any steps have handrails.  Any raised decks and porches should have guardrails on the edges.  Have any leaves, snow, or ice cleared regularly.  Use sand or salt on walking paths during winter.  Clean up any spills in your garage right away. This includes oil or grease spills. What can I do in the bathroom?  Use night lights.  Install grab bars by the toilet and in the tub and shower. Do not use towel bars as grab bars.  Use non-skid mats or decals in the tub or shower.  If you need to sit down in the shower, use a plastic, non-slip stool.  Keep the floor dry. Clean up any water that spills on the floor as soon as it happens.  Remove soap buildup in the tub or shower regularly.  Attach bath mats securely with double-sided  non-slip rug tape.  Do not have throw rugs and other things on the floor that can make you trip. What can I do in the bedroom?  Use night lights.  Make sure that you have a light by your bed that is easy to reach.  Do not use any sheets or blankets that are too big for your bed. They should not hang down onto the floor.  Have a firm chair that has side arms. You can use this for support while you get dressed.  Do not have throw rugs and other things on the floor that can make you trip. What can I do in the kitchen?  Clean up any spills right away.  Avoid walking on wet floors.  Keep items that you use a lot in easy-to-reach places.  If you need to reach something above you, use a strong step stool that has a grab bar.  Keep electrical cords out of the way.  Do not use floor polish or wax that makes floors slippery. If you must use wax, use non-skid floor wax.  Do not have throw rugs and other things on the floor that can make you trip. What can  I do with my stairs?  Do not leave any items on the stairs.  Make sure that there are handrails on both sides of the stairs and use them. Fix handrails that are broken or loose. Make sure that handrails are as long as the stairways.  Check any carpeting to make sure that it is firmly attached to the stairs. Fix any carpet that is loose or worn.  Avoid having throw rugs at the top or bottom of the stairs. If you do have throw rugs, attach them to the floor with carpet tape.  Make sure that you have a light switch at the top of the stairs and the bottom of the stairs. If you do not have them, ask someone to add them for you. What else can I do to help prevent falls?  Wear shoes that:  Do not have high heels.  Have rubber bottoms.  Are comfortable and fit you well.  Are closed at the toe. Do not wear sandals.  If you use a stepladder:  Make sure that it is fully opened. Do not climb a closed stepladder.  Make sure that both  sides of the stepladder are locked into place.  Ask someone to hold it for you, if possible.  Clearly mark and make sure that you can see:  Any grab bars or handrails.  First and last steps.  Where the edge of each step is.  Use tools that help you move around (mobility aids) if they are needed. These include:  Canes.  Walkers.  Scooters.  Crutches.  Turn on the lights when you go into a dark area. Replace any light bulbs as soon as they burn out.  Set up your furniture so you have a clear path. Avoid moving your furniture around.  If any of your floors are uneven, fix them.  If there are any pets around you, be aware of where they are.  Review your medicines with your doctor. Some medicines can make you feel dizzy. This can increase your chance of falling. Ask your doctor what other things that you can do to help prevent falls. This information is not intended to replace advice given to you by your health care provider. Make sure you discuss any questions you have with your health care provider. Document Released: 04/20/2009 Document Revised: 11/30/2015 Document Reviewed: 07/29/2014 Elsevier Interactive Patient Education  2017 [employer].

## 2017-06-23 NOTE — Progress Notes (Signed)
Subjective:   Scott Potts is a 71 y.o. male who presents for Medicare Annual/Subsequent preventive examination.  Review of Systems:  N/A  Cardiac Risk Factors include: advanced age (>38men, >91 women)     Objective:    Vitals: BP 118/80 (BP Location: Left Arm)   Pulse 76   Temp 97.7 F (36.5 C) (Oral)   Ht 6\' 1"  (1.854 m)   Wt 189 lb 3.2 oz (85.8 kg)   BMI 24.96 kg/m   Body mass index is 24.96 kg/m.  Advanced Directives 06/23/2017  Does Patient Have a Medical Advance Directive? Yes  Type of Advance Directive Living will    Tobacco Social History   Tobacco Use  Smoking Status Former Smoker  Smokeless Tobacco Never Used  Tobacco Comment   quit 2009- 1-2 a day     Counseling given: Not Answered Comment: quit 2009- 1-2 a day   Clinical Intake:  Pre-visit preparation completed: Yes  Pain : No/denies pain Pain Score: 0-No pain     Nutritional Status: BMI of 19-24  Normal Nutritional Risks: None Diabetes: No  How often do you need to have someone help you when you read instructions, pamphlets, or other written materials from your doctor or pharmacy?: 1 - Never What is the last grade level you completed in school?: BA Degree  Interpreter Needed?: No  Information entered by :: [facility], LPN  Past Medical History:  Diagnosis Date  . History of nephrolithiasis   . History of vein stripping   . Kidney stones   . Mitral valve regurgitation   . Seizure ([place])    6 months ago while sleeping  . Thrombophlebitis of lower extremities    left   Past Surgical History:  Procedure Laterality Date  . CARDIAC CATHETERIZATION  01/05/08  . COLONOSCOPY  2005  . endocardiogram  2009  . heart valve repair  2009  . VARICOSE VEIN SURGERY     vein strip left leg   Family History  Problem Relation Age of Onset  . Coronary artery disease Neg Hx   . Diabetes Neg Hx   . Hypertension Neg Hx    Social History   Socioeconomic History  . Marital status: Married    Spouse name: Rodena Piety  . Number of children: 2  . Years of education: None  . Highest education level: None  Social Needs  . Financial resource strain: Not hard at all  . Food insecurity - worry: Never true  . Food insecurity - inability: Never true  . Transportation needs - medical: No  . Transportation needs - non-medical: No  Occupational History  . Occupation: Retire    Employer: [employer]  Tobacco Use  . Smoking status: Former Research scientist (life sciences)  . Smokeless tobacco: Never Used  . Tobacco comment: quit 2009- 1-2 a day  Substance and Sexual Activity  . Alcohol use: Yes    Comment: rare  . Drug use: No  . Sexual activity: None  Other Topics Concern  . None  Social History Narrative   Pt lives alone. Moderate amount of caffeine daily. Regular exercise.    Outpatient Encounter Medications as of 06/23/2017  Medication Sig  . levETIRAcetam (KEPPRA) 500 MG tablet Take 1 tablet by mouth 2 (two) times daily.  . Multiple Vitamin (MULTIVITAMIN) tablet Take 1 tablet by mouth daily.    . sildenafil (REVATIO) 20 MG tablet TAKE 5 TABLETS EVERY DAY AS NEEDED   No facility-administered encounter medications on file as of 06/23/2017.  Activities of Daily Living In your present state of health, do you have any difficulty performing the following activities: 06/23/2017  Hearing? N  Vision? N  Difficulty concentrating or making decisions? N  Walking or climbing stairs? N  Dressing or bathing? N  Doing errands, shopping? N  Preparing Food and eating ? N  Using the Toilet? N  In the past six months, have you accidently leaked urine? N  Do you have problems with loss of bowel control? N  Managing your Medications? N  Managing your Finances? N  Housekeeping or managing your Housekeeping? N  Some recent data might be hidden    Patient Care Team: Chrismon, Vickki Muff, PA as PCP - General (Family Medicine) Christene Lye, MD (General Surgery) Vladimir Crofts, MD as Consulting Physician  (Neurology)   Assessment:   This is a routine wellness examination for [name].  Exercise Activities and Dietary recommendations Current Exercise Habits: Home exercise routine;Structured exercise class, Type of exercise: walking;Other - see comments(golf), Time (Minutes): 60, Frequency (Times/Week): 7, Weekly Exercise (Minutes/Week): 420, Intensity: Mild, Exercise limited by: None identified  Goals    . DIET - INCREASE WATER INTAKE     Recommend increasing water intake to 4-6 glasses of water a day.        Fall Risk Fall Risk  06/23/2017 06/17/2016 06/15/2015 06/15/2015  Falls in the past year? No No No No   Is the patient's home free of loose throw rugs in walkways, pet beds, electrical cords, etc? yes      Grab bars in the bathroom? no      Handrails on the stairs?   yes      Adequate lighting?   no  Timed Get Up and Go Performed: N/A  Depression Screen PHQ 2/9 Scores 06/23/2017 06/23/2017 06/17/2016 06/15/2015  PHQ - 2 Score 0 0 0 0  PHQ- 9 Score 0 - - -    Cognitive Function: Pt declined screening today.        Immunization History  Administered Date(s) Administered  . Influenza, High Dose Seasonal PF 03/31/2014, 06/19/2015, 06/17/2016, 06/23/2017  . Pneumococcal Conjugate-13 12/13/2013  . Pneumococcal Polysaccharide-23 12/09/2012  . Tdap 11/05/2005, 11/25/2008    Qualifies for Shingles Vaccine? Pt declined vaccine today.  Screening Tests Health Maintenance  Topic Date Due  . TETANUS/TDAP  11/26/2018  . COLONOSCOPY  02/10/2024  . INFLUENZA VACCINE  Completed  . Hepatitis C Screening  Completed  . PNA vac Low Risk Adult  Completed   Cancer Screenings: Lung: Low Dose CT Chest recommended if Age 58-80 years, 30 pack-year currently smoking OR have quit w/in 15years. Patient does not qualify. Colorectal: up to date  Additional Screenings:  Hepatitis B/HIV/Syphillis: Pt declined screening today. Hepatitis C Screening: Pt declined screening today.    Plan:  I  have personally reviewed and addressed the Medicare Annual Wellness questionnaire and have noted the following in the patient's chart:  A. Medical and social history B. Use of alcohol, tobacco or illicit drugs  C. Current medications and supplements D. Functional ability and status E.  Nutritional status F.  Physical activity G. Advance directives H. List of other physicians I.  Hospitalizations, surgeries, and ER visits in previous 12 months J.  [place] such as hearing and vision if needed, cognitive and depression L. Referrals and appointments - none  In addition, I have reviewed and discussed with patient certain preventive protocols, quality metrics, and best practice recommendations. A written personalized care plan  for preventive services as well as general preventive health recommendations were provided to patient.  See attached scanned questionnaire for additional information.   Signed,  Fabio Neighbors, LPN Nurse Health Advisor   Nurse Recommendations: None.  Reviewed Nurse Health Advisor's documentation and recommendations. Agree with plan. Was available for consultation during screening.

## 2017-06-23 NOTE — Progress Notes (Signed)
Patient: Scott Potts, Male    DOB: 05-Jul-1946, 71 y.o.   MRN: [id number] Visit Date: 06/23/2017  Today's Provider: Vernie Murders, PA   No chief complaint on file.  Subjective:    Annual wellness visit Scott Potts is a 71 y.o. male. He feels well. He reports exercising daily (walking 7 days a week and a workout for an hour in the gym 2 days a week). He reports he is sleeping well.  -----------------------------------------------------------   Review of Systems  Constitutional: Negative.   HENT: Negative.   Eyes: Negative.   Respiratory: Negative.   Cardiovascular: Negative.   Gastrointestinal: Negative.   Endocrine: Negative.   Genitourinary: Negative.   Musculoskeletal: Negative.   Skin: Negative.   Allergic/Immunologic: Negative.   Neurological: Negative.   Hematological: Negative.   Psychiatric/Behavioral: Negative.    Social History   Socioeconomic History  . Marital status: Married    Spouse name: Rodena Piety  . Number of children: 2  . Years of education: Not on file  . Highest education level: Not on file  Social Needs  . Financial resource strain: Not hard at all  . Food insecurity - worry: Never true  . Food insecurity - inability: Never true  . Transportation needs - medical: No  . Transportation needs - non-medical: No  Occupational History  . Occupation: Retire    Employer: [employer]  Tobacco Use  . Smoking status: Former Research scientist (life sciences)  . Smokeless tobacco: Never Used  . Tobacco comment: quit 2009- 1-2 a day  Substance and Sexual Activity  . Alcohol use: Yes    Comment: rare  . Drug use: No  . Sexual activity: Not on file  Other Topics Concern  . Not on file  Social History Narrative   Pt lives alone. Moderate amount of caffeine daily. Regular exercise.   Past Medical History:  Diagnosis Date  . History of nephrolithiasis   . History of vein stripping   . Kidney stones   . Mitral valve regurgitation   . Seizure ([place])    6 months ago  while sleeping  . Thrombophlebitis of lower extremities    left    Patient Active Problem List   Diagnosis Date Noted  . Blood in the urine 06/15/2015  . Seizure ([place]) 06/15/2015  . Family history of malignant neoplasm of prostate 03/27/2015  . Asymptomatic PVCs 10/16/2011  . Mitral valve disorder 05/08/2009  . ED (erectile dysfunction) of organic origin 03/03/2009  . Disorder of mitral valve 11/23/2007  . Current tobacco use 11/20/2006  . Calculus of kidney 07/08/2000    Past Surgical History:  Procedure Laterality Date  . CARDIAC CATHETERIZATION  01/05/08  . COLONOSCOPY  2005  . endocardiogram  2009  . heart valve repair  2009  . VARICOSE VEIN SURGERY     vein strip left leg   Family History  Problem Relation Age of Onset  . Coronary artery disease Neg Hx   . Diabetes Neg Hx   . Hypertension Neg Hx       Current Outpatient Medications:  .  levETIRAcetam (KEPPRA) 500 MG tablet, Take 1 tablet by mouth 2 (two) times daily., Disp: , Rfl:  .  Multiple Vitamin (MULTIVITAMIN) tablet, Take 1 tablet by mouth daily.  , Disp: , Rfl:  .  sildenafil (REVATIO) 20 MG tablet, TAKE 5 TABLETS EVERY DAY AS NEEDED, Disp: 30 tablet, Rfl: 0  Patient Care Team: Chrismon, Vickki Muff, PA as PCP - General (  Family Medicine) Christene Lye, MD (General Surgery) Vladimir Crofts, MD as Consulting Physician (Neurology)     Objective:   Vitals: BP 118/80   Pulse 76   Temp 97.7 F (36.5 C) (Oral)   Ht 6\' 1"  (1.854 m)   Wt 189 lb (85.7 kg)   BMI 24.94 kg/m  Wt Readings from Last 3 Encounters:  06/23/17 189 lb (85.7 kg)  06/23/17 189 lb 3.2 oz (85.8 kg)  12/19/16 184 lb (83.5 kg)    Physical Exam  Constitutional: He is oriented to person, place, and time. He appears well-developed and well-nourished.  HENT:  Head: Normocephalic and atraumatic.  Right Ear: External ear normal.  Left Ear: External ear normal.  Nose: Nose normal.  Mouth/Throat: Oropharynx is clear and moist.    Eyes: Conjunctivae and EOM are normal. Pupils are equal, round, and reactive to light. Right eye exhibits no discharge.  Neck: Normal range of motion. Neck supple. No tracheal deviation present. No thyromegaly present.  Cardiovascular: Normal rate, regular rhythm, normal heart sounds and intact distal pulses.  No murmur heard. Pulmonary/Chest: Effort normal and breath sounds normal. No respiratory distress. He has no wheezes. He has no rales. He exhibits no tenderness.  Abdominal: Soft. He exhibits no distension and no mass. There is no tenderness. There is no rebound and no guarding.  Genitourinary: Rectum normal, prostate normal and penis normal. Rectal exam shows guaiac negative stool.  Genitourinary Comments: Cyst in between testicles in lower scrotum unchanged without discomfort.  Musculoskeletal: Normal range of motion. He exhibits no edema or tenderness.  Lymphadenopathy:    He has no cervical adenopathy.  Neurological: He is alert and oriented to person, place, and time. He has normal reflexes. No cranial nerve deficit. He exhibits normal muscle tone. Coordination normal.  Skin: Skin is warm and dry. No rash noted. No erythema.  Psychiatric: He has a normal mood and affect. His behavior is normal. Judgment and thought content normal.    Activities of Daily Living In your present state of health, do you have any difficulty performing the following activities: 06/23/2017  Hearing? N  Vision? N  Difficulty concentrating or making decisions? N  Walking or climbing stairs? N  Dressing or bathing? N  Doing errands, shopping? N  Preparing Food and eating ? N  Using the Toilet? N  In the past six months, have you accidently leaked urine? N  Do you have problems with loss of bowel control? N  Managing your Medications? N  Managing your Finances? N  Housekeeping or managing your Housekeeping? N  Some recent data might be hidden     Assessment & Plan:     Annual Wellness  Visit  Reviewed patient's Family Medical History Reviewed and updated list of patient's medical providers Assessment of cognitive impairment was done Assessed patient's functional ability Established a written schedule for health screening [place] Completed and Reviewed  Exercise Activities and Dietary recommendations Goals    . DIET - INCREASE WATER INTAKE     Recommend increasing water intake to 4-6 glasses of water a day.        Immunization History  Administered Date(s) Administered  . Influenza, High Dose Seasonal PF 03/31/2014, 06/19/2015, 06/17/2016, 06/23/2017  . Pneumococcal Conjugate-13 12/13/2013  . Pneumococcal Polysaccharide-23 12/09/2012  . Tdap 11/05/2005, 11/25/2008    Health Maintenance  Topic Date Due  . TETANUS/TDAP  11/26/2018  . COLONOSCOPY  02/10/2024  . INFLUENZA VACCINE  Completed  . Hepatitis  C Screening  Completed  . PNA vac Low Risk Adult  Completed     Discussed health benefits of physical activity, and encouraged him to engage in regular exercise appropriate for his age and condition.    ------------------------------------------------------------------------------------------------------------ 1. Annual physical exam General health good. Continue regular exercise program and low fat diet. Immunizations up to date. Given anticipatory counseling. All medicare screenings normal.  2. Seizure ([place]) Last seizure was a couple years ago. Still taking the Keppra 500 mg BID and followed by Dr. Manuella Ghazi (neurologist) annually. Recheck routine labs and follow up pending reports. - CBC with Differential/Platelet - TSH - COMPLETE METABOLIC PANEL WITH GFR  3. ED (erectile dysfunction) of organic origin Rare use of Viagra 20 mg 1-5 tablets prior to intercourse. Still working well without side effects.  4. Elevated LDL cholesterol level LDL was 112 in Dec. 2017 and has a history of mitral valve annuloplasty in 2008 (followed by Dr.  Johnsie Cancel - cardiologist 12-19-16). Recheck CMP and LIpid panel. - Lipid panel - COMPLETE METABOLIC PANEL WITH GFR    Vernie Murders, PA  [facility]

## 2017-06-24 ENCOUNTER — Telehealth: Payer: Self-pay

## 2017-06-24 NOTE — Telephone Encounter (Signed)
-----   Message from Margo Common, [place] sent at 06/24/2017  8:22 AM EST ----- All blood tests normal except LDL higher than previous years. Recommend Krill Oil and Red Yeast Rice supplement daily and low fat diet with regular exercise. Recheck levels in 3 months.

## 2017-06-24 NOTE — Telephone Encounter (Signed)
Patient's wife advised as below. Patient's wife verbalizes understanding and is in agreement with treatment plan.   

## 2017-06-28 ENCOUNTER — Other Ambulatory Visit: Payer: Self-pay | Admitting: Family Medicine

## 2017-07-31 DIAGNOSIS — G40909 Epilepsy, unspecified, not intractable, without status epilepticus: Secondary | ICD-10-CM | POA: Diagnosis not present

## 2017-09-22 ENCOUNTER — Telehealth: Payer: Self-pay | Admitting: Family Medicine

## 2017-09-22 ENCOUNTER — Other Ambulatory Visit: Payer: Self-pay | Admitting: Family Medicine

## 2017-09-22 DIAGNOSIS — E7800 Pure hypercholesterolemia, unspecified: Secondary | ICD-10-CM

## 2017-09-22 NOTE — Telephone Encounter (Signed)
Tests placed in chart as a future order. Please release to print requisition when he comes to the office on 08-28-17.

## 2017-09-22 NOTE — Telephone Encounter (Signed)
Lab slipped printed at front desk for pick up. Patient is aware.

## 2017-09-22 NOTE — Telephone Encounter (Signed)
Patient called stating its time for him to get his cholesterol levels checked again.  Can you get his lab requis ready for Thursday morning please.

## 2017-09-25 DIAGNOSIS — E7800 Pure hypercholesterolemia, unspecified: Secondary | ICD-10-CM | POA: Diagnosis not present

## 2017-09-26 ENCOUNTER — Telehealth: Payer: Self-pay

## 2017-09-26 LAB — COMPREHENSIVE METABOLIC PANEL
ALT: 20 IU/L (ref 0–44)
AST: 24 IU/L (ref 0–40)
Albumin/Globulin Ratio: 1.9 (ref 1.2–2.2)
Albumin: 4.4 g/dL (ref 3.5–4.8)
Alkaline Phosphatase: 56 IU/L (ref 39–117)
BUN/Creatinine Ratio: 15 (ref 10–24)
BUN: 14 mg/dL (ref 8–27)
Bilirubin Total: 0.5 mg/dL (ref 0.0–1.2)
CO2: 24 mmol/L (ref 20–29)
CREATININE: 0.93 mg/dL (ref 0.76–1.27)
Calcium: 9.2 mg/dL (ref 8.6–10.2)
Chloride: 104 mmol/L (ref 96–106)
GFR calc Af Amer: 96 mL/min/{1.73_m2} (ref >59)
GFR, EST NON AFRICAN AMERICAN: 83 mL/min/{1.73_m2} (ref >59)
GLOBULIN, TOTAL: 2.3 g/dL (ref 1.5–4.5)
Glucose: 88 mg/dL (ref 65–99)
Potassium: 4.9 mmol/L (ref 3.5–5.2)
SODIUM: 142 mmol/L (ref 134–144)
Total Protein: 6.7 g/dL (ref 6.0–8.5)

## 2017-09-26 LAB — LIPID PANEL
CHOL/HDL RATIO: 3.1 ratio (ref 0.0–5.0)
Cholesterol, Total: 185 mg/dL (ref 100–199)
HDL: 59 mg/dL (ref >39)
LDL CALC: 117 mg/dL — AB (ref 0–99)
Triglycerides: 43 mg/dL (ref 0–149)
VLDL Cholesterol Cal: 9 mg/dL (ref 5–40)

## 2017-09-26 NOTE — Telephone Encounter (Signed)
Patient called back and you were unavailable.  He states that he does not need a call back unless you need to talk to him because he can see the results on Mychart.

## 2017-09-26 NOTE — Telephone Encounter (Signed)
-----   Message from Margo Common, [place] sent at 09/26/2017  8:15 AM EDT ----- Blood tests normal except LDL cholesterol elevated. With history of mitral valve issues, should consider a statin if cardiologist agrees.

## 2017-09-26 NOTE — Telephone Encounter (Signed)
LMTCB

## 2017-09-29 NOTE — Telephone Encounter (Signed)
Patient advised. He prefers to work on lifestyle modifications before starting a statin medication. He will schedule a follow up in 3 months to recheck progress of LDL levels.

## 2017-09-29 NOTE — Telephone Encounter (Signed)
Acknowledged.

## 2017-11-01 ENCOUNTER — Other Ambulatory Visit: Payer: Self-pay | Admitting: Family Medicine

## 2018-06-02 DIAGNOSIS — L570 Actinic keratosis: Secondary | ICD-10-CM | POA: Diagnosis not present

## 2018-06-02 DIAGNOSIS — L821 Other seborrheic keratosis: Secondary | ICD-10-CM | POA: Diagnosis not present

## 2018-06-02 DIAGNOSIS — L820 Inflamed seborrheic keratosis: Secondary | ICD-10-CM | POA: Diagnosis not present

## 2018-06-02 DIAGNOSIS — D485 Neoplasm of uncertain behavior of skin: Secondary | ICD-10-CM | POA: Diagnosis not present

## 2018-06-02 DIAGNOSIS — D1800 Hemangioma unspecified site: Secondary | ICD-10-CM | POA: Diagnosis not present

## 2018-06-02 DIAGNOSIS — C4442 Squamous cell carcinoma of skin of scalp and neck: Secondary | ICD-10-CM | POA: Diagnosis not present

## 2018-06-02 DIAGNOSIS — Z1283 Encounter for screening for malignant neoplasm of skin: Secondary | ICD-10-CM | POA: Diagnosis not present

## 2018-06-02 DIAGNOSIS — L812 Freckles: Secondary | ICD-10-CM | POA: Diagnosis not present

## 2018-06-02 DIAGNOSIS — C4492 Squamous cell carcinoma of skin, unspecified: Secondary | ICD-10-CM

## 2018-06-02 DIAGNOSIS — L578 Other skin changes due to chronic exposure to nonionizing radiation: Secondary | ICD-10-CM | POA: Diagnosis not present

## 2018-06-02 HISTORY — DX: Squamous cell carcinoma of skin, unspecified: C44.92

## 2018-06-25 ENCOUNTER — Ambulatory Visit (INDEPENDENT_AMBULATORY_CARE_PROVIDER_SITE_OTHER): Payer: Medicare PPO

## 2018-06-25 VITALS — BP 120/68 | HR 93 | Temp 97.8°F | Ht 73.0 in | Wt 183.0 lb

## 2018-06-25 DIAGNOSIS — Z0000 Encounter for general adult medical examination without abnormal findings: Secondary | ICD-10-CM | POA: Diagnosis not present

## 2018-06-25 DIAGNOSIS — Z23 Encounter for immunization: Secondary | ICD-10-CM | POA: Diagnosis not present

## 2018-06-25 NOTE — Patient Instructions (Signed)
Scott Potts , Thank you for taking time to come for your Medicare Wellness Visit. I appreciate your ongoing commitment to your health goals. Please review the following plan we discussed and let me know if I can assist you in the future.   Screening recommendations/referrals: Colonoscopy: Up to date, due 02/2024 Recommended yearly ophthalmology/optometry visit for glaucoma screening and checkup Recommended yearly dental visit for hygiene and checkup  Vaccinations: Influenza vaccine: Administered today Pneumococcal vaccine: Completed series Tdap vaccine: Up to date, due 11/2018 Shingles vaccine: Pt declines today.     Advanced directives: Currently on file.   Conditions/risks identified: Recommend to increase water intake to 6-8 glasses of water a day.   Next appointment: 07/06/18 @ 10:40 AM with [place] 65 Years and Older, Male Preventive care refers to lifestyle choices and visits with your health care provider that can promote health and wellness. What does preventive care include?  A yearly physical exam. This is also called an annual well check.  Dental exams once or twice a year.  Routine eye exams. Ask your health care provider how often you should have your eyes checked.  Personal lifestyle choices, including:  Daily care of your teeth and gums.  Regular physical activity.  Eating a healthy diet.  Avoiding tobacco and drug use.  Limiting alcohol use.  Practicing safe sex.  Taking low doses of aspirin every day.  Taking vitamin and mineral supplements as recommended by your health care provider. What happens during an annual well check? The services and screenings done by your health care provider during your annual well check will depend on your age, overall health, lifestyle risk factors, and family history of disease. Counseling  Your health care provider may ask you questions about your:  Alcohol use.  Tobacco use.  Drug  use.  Emotional well-being.  Home and relationship well-being.  Sexual activity.  Eating habits.  History of falls.  Memory and ability to understand (cognition).  Work and work Statistician. Screening  You may have the following tests or measurements:  Height, weight, and BMI.  Blood pressure.  Lipid and cholesterol levels. These may be checked every 5 years, or more frequently if you are over 58 years old.  Skin check.  Lung cancer screening. You may have this screening every year starting at age 63 if you have a 30-pack-year history of smoking and currently smoke or have quit within the past 15 years.  Fecal occult blood test (FOBT) of the stool. You may have this test every year starting at age 72.  Flexible sigmoidoscopy or colonoscopy. You may have a sigmoidoscopy every 5 years or a colonoscopy every 10 years starting at age 28.  Prostate cancer screening. Recommendations will vary depending on your family history and other risks.  Hepatitis C blood test.  Hepatitis B blood test.  Sexually transmitted disease (STD) testing.  Diabetes screening. This is done by checking your blood sugar (glucose) after you have not eaten for a while (fasting). You may have this done every 1-3 years.  Abdominal aortic aneurysm (AAA) screening. You may need this if you are a current or former smoker.  Osteoporosis. You may be screened starting at age 97 if you are at high risk. Talk with your health care provider about your test results, treatment options, and if necessary, the need for more tests. Vaccines  Your health care provider may recommend certain vaccines, such as:  Influenza vaccine. This is recommended every year.  Tetanus, diphtheria,  and acellular pertussis (Tdap, Td) vaccine. You may need a Td booster every 10 years.  Zoster vaccine. You may need this after age 81.  Pneumococcal 13-valent conjugate (PCV13) vaccine. One dose is recommended after age  29.  Pneumococcal polysaccharide (PPSV23) vaccine. One dose is recommended after age 35. Talk to your health care provider about which screenings and vaccines you need and how often you need them. This information is not intended to replace advice given to you by your health care provider. Make sure you discuss any questions you have with your health care provider. Document Released: 07/21/2015 Document Revised: 03/13/2016 Document Reviewed: 04/25/2015 Elsevier Interactive Patient Education  2017 [employer] Prevention in the Home Falls can cause injuries. They can happen to people of all ages. There are many things you can do to make your home safe and to help prevent falls. What can I do on the outside of my home?  Regularly fix the edges of walkways and driveways and fix any cracks.  Remove anything that might make you trip as you walk through a door, such as a raised step or threshold.  Trim any bushes or trees on the path to your home.  Use bright outdoor lighting.  Clear any walking paths of anything that might make someone trip, such as rocks or tools.  Regularly check to see if handrails are loose or broken. Make sure that both sides of any steps have handrails.  Any raised decks and porches should have guardrails on the edges.  Have any leaves, snow, or ice cleared regularly.  Use sand or salt on walking paths during winter.  Clean up any spills in your garage right away. This includes oil or grease spills. What can I do in the bathroom?  Use night lights.  Install grab bars by the toilet and in the tub and shower. Do not use towel bars as grab bars.  Use non-skid mats or decals in the tub or shower.  If you need to sit down in the shower, use a plastic, non-slip stool.  Keep the floor dry. Clean up any water that spills on the floor as soon as it happens.  Remove soap buildup in the tub or shower regularly.  Attach bath mats securely with double-sided  non-slip rug tape.  Do not have throw rugs and other things on the floor that can make you trip. What can I do in the bedroom?  Use night lights.  Make sure that you have a light by your bed that is easy to reach.  Do not use any sheets or blankets that are too big for your bed. They should not hang down onto the floor.  Have a firm chair that has side arms. You can use this for support while you get dressed.  Do not have throw rugs and other things on the floor that can make you trip. What can I do in the kitchen?  Clean up any spills right away.  Avoid walking on wet floors.  Keep items that you use a lot in easy-to-reach places.  If you need to reach something above you, use a strong step stool that has a grab bar.  Keep electrical cords out of the way.  Do not use floor polish or wax that makes floors slippery. If you must use wax, use non-skid floor wax.  Do not have throw rugs and other things on the floor that can make you trip. What can I do with my  stairs?  Do not leave any items on the stairs.  Make sure that there are handrails on both sides of the stairs and use them. Fix handrails that are broken or loose. Make sure that handrails are as long as the stairways.  Check any carpeting to make sure that it is firmly attached to the stairs. Fix any carpet that is loose or worn.  Avoid having throw rugs at the top or bottom of the stairs. If you do have throw rugs, attach them to the floor with carpet tape.  Make sure that you have a light switch at the top of the stairs and the bottom of the stairs. If you do not have them, ask someone to add them for you. What else can I do to help prevent falls?  Wear shoes that:  Do not have high heels.  Have rubber bottoms.  Are comfortable and fit you well.  Are closed at the toe. Do not wear sandals.  If you use a stepladder:  Make sure that it is fully opened. Do not climb a closed stepladder.  Make sure that both  sides of the stepladder are locked into place.  Ask someone to hold it for you, if possible.  Clearly mark and make sure that you can see:  Any grab bars or handrails.  First and last steps.  Where the edge of each step is.  Use tools that help you move around (mobility aids) if they are needed. These include:  Canes.  Walkers.  Scooters.  Crutches.  Turn on the lights when you go into a dark area. Replace any light bulbs as soon as they burn out.  Set up your furniture so you have a clear path. Avoid moving your furniture around.  If any of your floors are uneven, fix them.  If there are any pets around you, be aware of where they are.  Review your medicines with your doctor. Some medicines can make you feel dizzy. This can increase your chance of falling. Ask your doctor what other things that you can do to help prevent falls. This information is not intended to replace advice given to you by your health care provider. Make sure you discuss any questions you have with your health care provider. Document Released: 04/20/2009 Document Revised: 11/30/2015 Document Reviewed: 07/29/2014 Elsevier Interactive Patient Education  2017 [employer].

## 2018-06-25 NOTE — Progress Notes (Addendum)
Subjective:   Scott Potts is a 72 y.o. male who presents for Medicare Annual/Subsequent preventive examination.  Review of Systems:  N/A  Cardiac Risk Factors include: advanced age (>96men, >19 women);male gender     Objective:    Vitals: BP 120/68 (BP Location: Right Arm)   Pulse 93   Temp 97.8 F (36.6 C) (Oral)   Ht 6\' 1"  (1.854 m)   Wt 183 lb (83 kg)   BMI 24.14 kg/m   Body mass index is 24.14 kg/m.  Advanced Directives 06/25/2018 06/23/2017  Does Patient Have a Medical Advance Directive? Yes Yes  Type of Advance Directive Living will Living will    Tobacco Social History   Tobacco Use  Smoking Status Former Smoker  Smokeless Tobacco Never Used  Tobacco Comment   quit 2009- 1-2 a day     Counseling given: Not Answered Comment: quit 2009- 1-2 a day   Clinical Intake:  Pre-visit preparation completed: Yes  Pain : No/denies pain Pain Score: 0-No pain     Nutritional Status: BMI of 19-24  Normal Nutritional Risks: None Diabetes: No  How often do you need to have someone help you when you read instructions, pamphlets, or other written materials from your doctor or pharmacy?: 1 - Never  Interpreter Needed?: No  Information entered by :: [facility], LPN  Past Medical History:  Diagnosis Date  . History of nephrolithiasis   . History of vein stripping   . Kidney stones   . Mitral valve regurgitation   . Seizure ([place])    6 months ago while sleeping  . Thrombophlebitis of lower extremities    left   Past Surgical History:  Procedure Laterality Date  . CARDIAC CATHETERIZATION  01/05/08  . COLONOSCOPY  2005  . endocardiogram  2009  . heart valve repair  2009  . VARICOSE VEIN SURGERY     vein strip left leg   History reviewed. No pertinent family history. Social History   Socioeconomic History  . Marital status: Married    Spouse name: Rodena Piety  . Number of children: 2  . Years of education: Not on file  . Highest education level:  Bachelor's degree (e.g., BA, AB, BS)  Occupational History  . Occupation: Retire    Employer: [employer]  . Financial resource strain: Not hard at all  . Food insecurity:    Worry: Never true    Inability: Never true  . Transportation needs:    Medical: No    Non-medical: No  Tobacco Use  . Smoking status: Former Research scientist (life sciences)  . Smokeless tobacco: Never Used  . Tobacco comment: quit 2009- 1-2 a day  Substance and Sexual Activity  . Alcohol use: Yes    Comment: rare / maybe 2 beers a month  . Drug use: No  . Sexual activity: Not on file  Lifestyle  . Physical activity:    Days per week: 2 days    Minutes per session: 60 min  . Stress: Not at all  Relationships  . Social connections:    Talks on phone: Three times a week    Gets together: More than three times a week    Attends religious service: Never    Active member of club or organization: No    Attends meetings of clubs or organizations: Never    Relationship status: Married  Other Topics Concern  . Not on file  Social History Narrative   Pt lives alone. Moderate amount of  caffeine daily. Regular exercise.    Outpatient Encounter Medications as of 06/25/2018  Medication Sig  . levETIRAcetam (KEPPRA) 500 MG tablet Take 1 tablet by mouth 2 (two) times daily.  . Multiple Vitamin (MULTIVITAMIN) tablet Take 1 tablet by mouth daily.    . Omega-3 Fatty Acids (RA FISH OIL) 1000 MG CAPS Take 1 capsule by mouth 2 (two) times daily.   . sildenafil (REVATIO) 20 MG tablet TAKE 5 TABLETS BY MOUTH AS NEEDED   No facility-administered encounter medications on file as of 06/25/2018.     Activities of Daily Living In your present state of health, do you have any difficulty performing the following activities: 06/25/2018  Hearing? N  Vision? N  Difficulty concentrating or making decisions? N  Walking or climbing stairs? N  Dressing or bathing? N  Doing errands, shopping? N  Preparing Food and eating ? N  Using the  Toilet? N  In the past six months, have you accidently leaked urine? N  Do you have problems with loss of bowel control? N  Managing your Medications? N  Managing your Finances? N  Housekeeping or managing your Housekeeping? N  Some recent data might be hidden    Patient Care Team: Chrismon, Vickki Muff, PA as PCP - General (Family Medicine) Vladimir Crofts, MD as Consulting Physician (Neurology)   Assessment:   This is a routine wellness examination for [name].  Exercise Activities and Dietary recommendations Current Exercise Habits: Home exercise routine, Type of exercise: Other - see comments(also plays golf 5 days a week), Time (Minutes): 60, Frequency (Times/Week): 2, Weekly Exercise (Minutes/Week): 120, Intensity: Moderate  Goals    . DIET - INCREASE WATER INTAKE     Recommend increasing water intake to 4-6 glasses of water a day.        Fall Risk Fall Risk  06/25/2018 06/23/2017 06/17/2016 06/15/2015 06/15/2015  Falls in the past year? 0 No No No No   FALL RISK PREVENTION PERTAINING TO THE HOME:  Any stairs in or around the home WITH handrails? No  Home free of loose throw rugs in walkways, pet beds, electrical cords, etc? Yes  Adequate lighting in your home to reduce risk of falls? Yes   ASSISTIVE DEVICES UTILIZED TO PREVENT FALLS:  Life alert? No  Use of a cane, walker or w/c? No  Grab bars in the bathroom? No  Shower chair or bench in shower? No  Elevated toilet seat or a handicapped toilet? No    TIMED UP AND GO:  Was the test performed? No .    Depression Screen PHQ 2/9 Scores 06/25/2018 06/25/2018 06/23/2017 06/23/2017  PHQ - 2 Score 0 0 0 0  PHQ- 9 Score 0 - 0 -    Cognitive Function : Declined screening today.         Immunization History  Administered Date(s) Administered  . Influenza, High Dose Seasonal PF 03/31/2014, 06/19/2015, 06/17/2016, 06/23/2017  . Pneumococcal Conjugate-13 12/13/2013  . Pneumococcal Polysaccharide-23 12/09/2012  . Tdap  11/05/2005, 11/25/2008    Qualifies for Shingles Vaccine? Yes . Due for Shingrix. Education has been provided regarding the importance of this vaccine. Pt has been advised to call insurance company to determine out of pocket expense. Advised may also receive vaccine at local pharmacy or Health Dept. Verbalized acceptance and understanding.  Tdap: Up to date, due 11/2018  Flu Vaccine: Up to date  Pneumococcal Vaccine: Up to date   Screening Tests Health Maintenance  Topic Date Due  .  TETANUS/TDAP  11/26/2018  . COLONOSCOPY  02/10/2024  . INFLUENZA VACCINE  Completed  . Hepatitis C Screening  Completed  . PNA vac Low Risk Adult  Completed   Cancer Screenings:  Colorectal Screening: Completed 02/09/14. Repeat every 10 years.  Lung Cancer Screening: (Low Dose CT Chest recommended if Age 37-80 years, 30 pack-year currently smoking OR have quit w/in 15years.) does qualify, however declines order today.    Additional Screening:  Hepatitis C Screening: Up to date  Vision Screening: Recommended annual ophthalmology exams for early detection of glaucoma and other disorders of the eye.  Dental Screening: Recommended annual dental exams for proper oral hygiene  Community Resource Referral:  CRR required this visit?  No        Plan:  I have personally reviewed and addressed the Medicare Annual Wellness questionnaire and have noted the following in the patient's chart:  A. Medical and social history B. Use of alcohol, tobacco or illicit drugs  C. Current medications and supplements D. Functional ability and status E.  Nutritional status F.  Physical activity G. Advance directives H. List of other physicians I.  Hospitalizations, surgeries, and ER visits in previous 12 months J.  [place] such as hearing and vision if needed, cognitive and depression L. Referrals and appointments - none  In addition, I have reviewed and discussed with patient certain preventive  protocols, quality metrics, and best practice recommendations. A written personalized care plan for preventive services as well as general preventive health recommendations were provided to patient.  See attached scanned questionnaire for additional information.   Signed,  Fabio Neighbors, LPN Nurse Health Advisor   Nurse Recommendations: None.   Reviewed documentation and recommendations of Nurse Health Advisor during annual wellness screening. Was available for consultation. Agree with note and plan.

## 2018-07-06 ENCOUNTER — Encounter: Payer: Self-pay | Admitting: Family Medicine

## 2018-07-06 ENCOUNTER — Ambulatory Visit (INDEPENDENT_AMBULATORY_CARE_PROVIDER_SITE_OTHER): Payer: Medicare PPO | Admitting: Family Medicine

## 2018-07-06 VITALS — BP 110/60 | HR 77 | Temp 97.6°F | Resp 16 | Ht 73.0 in | Wt 183.0 lb

## 2018-07-06 DIAGNOSIS — Z789 Other specified health status: Secondary | ICD-10-CM

## 2018-07-06 DIAGNOSIS — Z8042 Family history of malignant neoplasm of prostate: Secondary | ICD-10-CM | POA: Diagnosis not present

## 2018-07-06 DIAGNOSIS — I493 Ventricular premature depolarization: Secondary | ICD-10-CM | POA: Diagnosis not present

## 2018-07-06 DIAGNOSIS — G8929 Other chronic pain: Secondary | ICD-10-CM | POA: Diagnosis not present

## 2018-07-06 DIAGNOSIS — M25511 Pain in right shoulder: Secondary | ICD-10-CM | POA: Diagnosis not present

## 2018-07-06 DIAGNOSIS — L578 Other skin changes due to chronic exposure to nonionizing radiation: Secondary | ICD-10-CM | POA: Diagnosis not present

## 2018-07-06 DIAGNOSIS — R351 Nocturia: Secondary | ICD-10-CM | POA: Diagnosis not present

## 2018-07-06 DIAGNOSIS — N486 Induration penis plastica: Secondary | ICD-10-CM | POA: Diagnosis not present

## 2018-07-06 DIAGNOSIS — R569 Unspecified convulsions: Secondary | ICD-10-CM

## 2018-07-06 DIAGNOSIS — L570 Actinic keratosis: Secondary | ICD-10-CM | POA: Diagnosis not present

## 2018-07-06 DIAGNOSIS — C4442 Squamous cell carcinoma of skin of scalp and neck: Secondary | ICD-10-CM | POA: Diagnosis not present

## 2018-07-06 DIAGNOSIS — I059 Rheumatic mitral valve disease, unspecified: Secondary | ICD-10-CM | POA: Diagnosis not present

## 2018-07-06 NOTE — Progress Notes (Signed)
Patient: Scott Potts, Male    DOB: Oct 28, 1945, 72 y.o.   MRN: [id number] Visit Date: 07/06/2018  Today's Provider: Vernie Murders, PA   Chief Complaint  Patient presents with  . Annual Exam   Subjective:   Patient saw McKenzie on 06/25/2018 for AWV.   Complete Physical Scott Potts is a 72 y.o. male. He feels well. He reports exercising yes. He reports he is sleeping well.  -----------------------------------------------------------   Review of Systems  HENT: Positive for tinnitus.   All other systems reviewed and are negative.   Social History   Socioeconomic History  . Marital status: Married    Spouse name: Rodena Piety  . Number of children: 2  . Years of education: Not on file  . Highest education level: Bachelor's degree (e.g., BA, AB, BS)  Occupational History  . Occupation: Retire    Employer: [employer]  . Financial resource strain: Not hard at all  . Food insecurity:    Worry: Never true    Inability: Never true  . Transportation needs:    Medical: No    Non-medical: No  Tobacco Use  . Smoking status: Former Research scientist (life sciences)  . Smokeless tobacco: Never Used  . Tobacco comment: quit 2009- 1-2 a day  Substance and Sexual Activity  . Alcohol use: Yes    Comment: rare / maybe 2 beers a month  . Drug use: No  . Sexual activity: Not on file  Lifestyle  . Physical activity:    Days per week: 2 days    Minutes per session: 60 min  . Stress: Not at all  Relationships  . Social connections:    Talks on phone: Three times a week    Gets together: More than three times a week    Attends religious service: Never    Active member of club or organization: No    Attends meetings of clubs or organizations: Never    Relationship status: Married  . Intimate partner violence:    Fear of current or ex partner: No    Emotionally abused: No    Physically abused: No    Forced sexual activity: No  Other Topics Concern  . Not on file  Social History  Narrative   Pt lives alone. Moderate amount of caffeine daily. Regular exercise.   Past Medical History:  Diagnosis Date  . History of nephrolithiasis   . History of vein stripping   . Kidney stones   . Mitral valve regurgitation   . Seizure ([place])    6 months ago while sleeping  . Thrombophlebitis of lower extremities    left    Patient Active Problem List   Diagnosis Date Noted  . Blood in the urine 06/15/2015  . Seizure ([place]) 06/15/2015  . Family history of malignant neoplasm of prostate 03/27/2015  . Asymptomatic PVCs 10/16/2011  . Mitral valve disorder 05/08/2009  . ED (erectile dysfunction) of organic origin 03/03/2009  . Disorder of mitral valve 11/23/2007  . Current tobacco use 11/20/2006  . Calculus of kidney 07/08/2000   Past Surgical History:  Procedure Laterality Date  . CARDIAC CATHETERIZATION  01/05/08  . COLONOSCOPY  2005  . endocardiogram  2009  . heart valve repair  2009  . VARICOSE VEIN SURGERY     vein strip left leg   His family history is not on file.      Current Outpatient Medications:  .  levETIRAcetam (KEPPRA) 500 MG tablet,  Take 1 tablet by mouth 2 (two) times daily., Disp: , Rfl:  .  Multiple Vitamin (MULTIVITAMIN) tablet, Take 1 tablet by mouth daily.  , Disp: , Rfl:  .  Omega-3 Fatty Acids (RA FISH OIL) 1000 MG CAPS, Take 1 capsule by mouth 2 (two) times daily. , Disp: , Rfl:  .  sildenafil (REVATIO) 20 MG tablet, TAKE 5 TABLETS BY MOUTH AS NEEDED, Disp: 30 tablet, Rfl: 0  Patient Care Team: Chrismon, Vickki Muff, PA as PCP - General (Family Medicine) Vladimir Crofts, MD as Consulting Physician (Neurology)     Objective:   Vitals: BP 110/60 (BP Location: Right Arm, Patient Position: Sitting, Cuff Size: Large)   Pulse 77   Temp 97.6 F (36.4 C) (Oral)   Resp 16   Ht 6\' 1"  (1.854 m)   Wt 183 lb (83 kg)   SpO2 97%   BMI 24.14 kg/m   Physical Exam Constitutional:      Appearance: He is well-developed.  HENT:     Head: Normocephalic  and atraumatic.     Right Ear: External ear normal.     Left Ear: External ear normal.     Nose: Nose normal.  Eyes:     General:        Right eye: No discharge.     Conjunctiva/sclera: Conjunctivae normal.     Pupils: Pupils are equal, round, and reactive to light.  Neck:     Musculoskeletal: Normal range of motion and neck supple.     Thyroid: No thyromegaly.     Trachea: No tracheal deviation.  Cardiovascular:     Rate and Rhythm: Normal rate.     Heart sounds: Normal heart sounds. No murmur.     Comments: PVC's every third beat initially on exam. Pulmonary:     Effort: Pulmonary effort is normal. No respiratory distress.     Breath sounds: Normal breath sounds. No wheezing or rales.  Chest:     Chest wall: No tenderness.  Abdominal:     General: There is no distension.     Palpations: Abdomen is soft. There is no mass.     Tenderness: There is no abdominal tenderness. There is no guarding or rebound.  Genitourinary:    Penis: Normal.      Prostate: Normal.     Rectum: Guaiac result negative.     Comments: Some soft skin tags at the anus from past hemorrhoid inflammation. Prostate enlarged without nodules. Left scrotal hydrocele asymptomatic. Musculoskeletal: Normal range of motion.        General: No tenderness.  Lymphadenopathy:     Cervical: No cervical adenopathy.  Skin:    General: Skin is warm and dry.     Findings: No erythema or rash.  Neurological:     Mental Status: He is alert and oriented to person, place, and time.     Cranial Nerves: No cranial nerve deficit.     Motor: No abnormal muscle tone.     Coordination: Coordination normal.     Deep Tendon Reflexes: Reflexes are normal and symmetric. Reflexes normal.  Psychiatric:        Behavior: Behavior normal.        Thought Content: Thought content normal.        Judgment: Judgment normal.     Activities of Daily Living In your present state of health, do you have any difficulty performing the  following activities: 06/25/2018  Hearing? N  Vision? N  Difficulty concentrating or  making decisions? N  Walking or climbing stairs? N  Dressing or bathing? N  Doing errands, shopping? N  Preparing Food and eating ? N  Using the Toilet? N  In the past six months, have you accidently leaked urine? N  Do you have problems with loss of bowel control? N  Managing your Medications? N  Managing your Finances? N  Housekeeping or managing your Housekeeping? N  Some recent data might be hidden   Fall Risk Assessment Fall Risk  06/25/2018 06/23/2017 06/17/2016 06/15/2015 06/15/2015  Falls in the past year? 0 No No No No   Depression Screen PHQ 2/9 Scores 06/25/2018 06/25/2018 06/23/2017 06/23/2017  PHQ - 2 Score 0 0 0 0  PHQ- 9 Score 0 - 0 -    Assessment & Plan:    Annual Physical Reviewed patient's Family Medical History Reviewed and updated list of patient's medical providers Assessment of cognitive impairment was done Assessed patient's functional ability Established a written schedule for health screening [place] Completed and Reviewed  Exercise Activities and Dietary recommendations Goals    . DIET - INCREASE WATER INTAKE     Recommend increasing water intake to 4-6 glasses of water a day.        Immunization History  Administered Date(s) Administered  . Influenza, High Dose Seasonal PF 03/31/2014, 06/19/2015, 06/17/2016, 06/23/2017, 06/25/2018  . Pneumococcal Conjugate-13 12/13/2013  . Pneumococcal Polysaccharide-23 12/09/2012  . Tdap 11/05/2005, 11/25/2008    Health Maintenance  Topic Date Due  . TETANUS/TDAP  11/26/2018  . COLONOSCOPY  02/10/2024  . INFLUENZA VACCINE  Completed  . Hepatitis C Screening  Completed  . PNA vac Low Risk Adult  Completed     Discussed health benefits of physical activity, and encouraged him to engage in regular exercise appropriate for his age and condition.      ------------------------------------------------------------------------------------------------------------ 1. Asymptomatic PVCs Noticed trigeminal pattern when auscultating chest today. EKG showed only an occasional PVC. Denies chest pains, palpitations, dyspnea or peripheral edema. Will check routine labs. Immunizations up to date and Medicare Annual Wellness Screening unremarkable. - EKG 12-Lead - CBC with Differential/Platelet - Comprehensive metabolic panel - TSH  2. Family history of malignant neoplasm of prostate Both brothers alive and well with a history of prostate cancer. This patient has been noticing nocturia 3-4 times a night recently. DRE shows some enlargement but no nodules in the prostate. Will check CMP and PSA. - Comprehensive metabolic panel - PSA  3. Seizure ([place]) No seizures since 2015. Still taking Kepra 500 mg BID and followed by Dr. Manuella Ghazi (last follow up 07-29-16). Recheck routine labs. - CBC with Differential/Platelet - Comprehensive metabolic panel - TSH  4. Disorder of mitral valve MVP, s/p annuloplasty ring in 2009 - stable, asymptomatic, echo in 2015 showed normal LVEF, normally working mitral valve normal transmitral gradients per evaluation by Dr. Johnsie Cancel (cardiologist) in June 2018. No chest pains, dyspnea or peripheral edema. Check routine labs and follow up with cardiologist if any symptoms occur. - CBC with Differential/Platelet - Comprehensive metabolic panel - Lipid panel  5. Nocturia Having nocturia 3-4 times a night the past several months. No hematuria or dysuria. No decrease in stream or intermittent flow. Will check CMP and PSA. Family history positive for 2 brothers having prostate cancer. - Comprehensive metabolic panel - PSA  6. Chronic right shoulder pain Onset without specific injury known over the past month or two. Some tenderness in the right anterior shoulder to reach behind his back or  lift overhead laterally. Suspect early  tendonitis. May use topical NSAID (Aspercreme or Capsacin) and moist heat with stretching exercises. May need further evaluation and consider cortisone injection if no better in 2-3 weeks.  7. Peyronie's disease Notice an increasing curvature of the penis with erections over the past several weeks. Also, notice a hydrocele in the left scrotum. Will schedule urology referral. - Ambulatory referral to Urology  8. Low density lipoprotein (LDL) cholesterol level greater than or equal to 100 mg/dl On 09-25-17, LDL still 117. Has added Krill Oil with Red Yeast Rice to his diet and exercise program. Will recheck labs. - Comprehensive metabolic panel - Lipid panel - TSH    Vernie Murders, PA  [facility]

## 2018-07-07 ENCOUNTER — Telehealth: Payer: Self-pay | Admitting: *Deleted

## 2018-07-07 LAB — LIPID PANEL
Chol/HDL Ratio: 3.3 ratio (ref 0.0–5.0)
Cholesterol, Total: 196 mg/dL (ref 100–199)
HDL: 59 mg/dL (ref >39)
LDL Calculated: 123 mg/dL — ABNORMAL HIGH (ref 0–99)
Triglycerides: 71 mg/dL (ref 0–149)
VLDL Cholesterol Cal: 14 mg/dL (ref 5–40)

## 2018-07-07 LAB — CBC WITH DIFFERENTIAL/PLATELET
Basophils Absolute: 0.1 10*3/uL (ref 0.0–0.2)
Basos: 1 %
EOS (ABSOLUTE): 0.2 10*3/uL (ref 0.0–0.4)
Eos: 4 %
Hematocrit: 36.2 % — ABNORMAL LOW (ref 37.5–51.0)
Hemoglobin: 12.3 g/dL — ABNORMAL LOW (ref 13.0–17.7)
Immature Grans (Abs): 0 10*3/uL (ref 0.0–0.1)
Immature Granulocytes: 0 %
Lymphocytes Absolute: 1.6 10*3/uL (ref 0.7–3.1)
Lymphs: 26 %
MCH: 29 pg (ref 26.6–33.0)
MCHC: 34 g/dL (ref 31.5–35.7)
MCV: 85 fL (ref 79–97)
Monocytes Absolute: 0.6 10*3/uL (ref 0.1–0.9)
Monocytes: 10 %
Neutrophils Absolute: 3.7 10*3/uL (ref 1.4–7.0)
Neutrophils: 59 %
PLATELETS: 264 10*3/uL (ref 150–450)
RBC: 4.24 x10E6/uL (ref 4.14–5.80)
RDW: 12.9 % (ref 12.3–15.4)
WBC: 6.2 10*3/uL (ref 3.4–10.8)

## 2018-07-07 LAB — COMPREHENSIVE METABOLIC PANEL
ALK PHOS: 51 IU/L (ref 39–117)
ALT: 18 IU/L (ref 0–44)
AST: 21 IU/L (ref 0–40)
Albumin/Globulin Ratio: 2.1 (ref 1.2–2.2)
Albumin: 4.4 g/dL (ref 3.5–4.8)
BILIRUBIN TOTAL: 0.7 mg/dL (ref 0.0–1.2)
BUN/Creatinine Ratio: 19 (ref 10–24)
BUN: 20 mg/dL (ref 8–27)
CHLORIDE: 103 mmol/L (ref 96–106)
CO2: 24 mmol/L (ref 20–29)
Calcium: 9.5 mg/dL (ref 8.6–10.2)
Creatinine, Ser: 1.04 mg/dL (ref 0.76–1.27)
GFR calc Af Amer: 83 mL/min/{1.73_m2} (ref >59)
GFR calc non Af Amer: 72 mL/min/{1.73_m2} (ref >59)
Globulin, Total: 2.1 g/dL (ref 1.5–4.5)
Glucose: 93 mg/dL (ref 65–99)
POTASSIUM: 4.9 mmol/L (ref 3.5–5.2)
Sodium: 142 mmol/L (ref 134–144)
Total Protein: 6.5 g/dL (ref 6.0–8.5)

## 2018-07-07 LAB — PSA: Prostate Specific Ag, Serum: 2.5 ng/mL (ref 0.0–4.0)

## 2018-07-07 LAB — TSH: TSH: 2.56 u[IU]/mL (ref 0.450–4.500)

## 2018-07-07 NOTE — Telephone Encounter (Signed)
-----   Message from Margo Common, [place] sent at 07/07/2018  7:54 AM EST ----- Blood tests normal except hgb slightly below normal and LDL cholesterol a little higher than last check. Be sure multivitamin he takes has additional iron in it and proceed with Red Yeast Rice with the [employer] (both twice a day). Recheck hgb and cholesterol in 3 months.

## 2018-07-07 NOTE — Telephone Encounter (Signed)
LMOVM for pt to return call 

## 2018-07-10 NOTE — Telephone Encounter (Signed)
Patient has been advised. KW 

## 2018-07-10 NOTE — Telephone Encounter (Signed)
[id number] 

## 2018-07-11 ENCOUNTER — Encounter: Payer: Self-pay | Admitting: Family Medicine

## 2018-07-30 DIAGNOSIS — G40909 Epilepsy, unspecified, not intractable, without status epilepticus: Secondary | ICD-10-CM | POA: Diagnosis not present

## 2018-08-18 ENCOUNTER — Ambulatory Visit: Payer: Medicare PPO | Admitting: Urology

## 2018-08-18 ENCOUNTER — Encounter: Payer: Self-pay | Admitting: Urology

## 2018-08-18 VITALS — BP 125/77 | HR 80 | Ht 73.0 in | Wt 183.8 lb

## 2018-08-18 DIAGNOSIS — N486 Induration penis plastica: Secondary | ICD-10-CM

## 2018-08-18 MED ORDER — PENTOXIFYLLINE ER 400 MG PO TBCR: 400.0000 mg | EXTENDED_RELEASE_TABLET | Freq: Three times a day (TID) | ORAL | 2 refills | Status: DC

## 2018-08-18 NOTE — Progress Notes (Signed)
08/18/2018 2:09 PM   Scott Potts [birth year] [id number]  Referring provider: Margo Common, [place] [address], [place] [address]  Chief Complaint  Patient presents with  . Abnormal Penile Curvature    HPI: 73 year old male seen at request of Vernie Murders for evaluation of Peyronie's disease.  Proximally 3 months ago he had acute onset of penile curvature with erection.  He states his penis curves to the left at the base and estimates the curvature at approximately 30 degrees.  He does note an hourglass deformity.  He has not been sexually active since noting the curvature but thinks it most likely would interfere with intercourse.  He denies pain with erections.  His curvature has been stable over the last 3 months.  He has a history of ADD and uses sildenafil although his sexual activity is infrequent.  He denies bothersome lower urinary tract symptoms.  Over the past 2 years he has noted increased nocturia.   PMH: Past Medical History:  Diagnosis Date  . History of nephrolithiasis   . History of vein stripping   . Kidney stones   . Mitral valve regurgitation   . Seizure ([place])    6 months ago while sleeping  . Thrombophlebitis of lower extremities    left    Surgical History: Past Surgical History:  Procedure Laterality Date  . CARDIAC CATHETERIZATION  01/05/08  . COLONOSCOPY  2005  . endocardiogram  2009  . heart valve repair  2009  . VARICOSE VEIN SURGERY     vein strip left leg    Home Medications:  Allergies as of 08/18/2018      Reactions   Penicillins Other (See Comments)   Unknown reaction, childhood reaction      Medication List       Accurate as of August 18, 2018  2:09 PM. Always use your most recent med list.        levETIRAcetam 500 MG tablet Commonly known as:  KEPPRA Take 1 tablet by mouth 2 (two) times daily.   multivitamin tablet Take 1 tablet by mouth daily.   RA FISH OIL 1000 MG Caps Take 1 capsule by mouth 2  (two) times daily.   RED YEAST RICE PO Take by mouth.   sildenafil 20 MG tablet Commonly known as:  REVATIO TAKE 5 TABLETS BY MOUTH AS NEEDED       Allergies:  Allergies  Allergen Reactions  . Penicillins Other (See Comments)    Unknown reaction, childhood reaction    Family History: No family history on file.  Social History:  reports that he has quit smoking. He has never used smokeless tobacco. He reports current alcohol use. He reports that he does not use drugs.  ROS: UROLOGY Frequent Urination?: No Hard to postpone urination?: No Burning/pain with urination?: No Get up at night to urinate?: Yes Leakage of urine?: No Urine stream starts and stops?: No Trouble starting stream?: No Do you have to strain to urinate?: No Blood in urine?: No Urinary tract infection?: No Sexually transmitted disease?: No Injury to kidneys or bladder?: No Painful intercourse?: No Weak stream?: No Erection problems?: No Penile pain?: No  Gastrointestinal Nausea?: No Vomiting?: No Indigestion/heartburn?: No Diarrhea?: No Constipation?: No  Constitutional Fever: No Night sweats?: No Weight loss?: No Fatigue?: No  Skin Skin rash/lesions?: No Itching?: No  Eyes Blurred vision?: No Double vision?: No  Ears/Nose/Throat Sore throat?: No Sinus problems?: No  Hematologic/Lymphatic Swollen glands?: No Easy bruising?: No  Cardiovascular Leg swelling?: No Chest pain?: No  Respiratory Cough?: No Shortness of breath?: No  Endocrine Excessive thirst?: No  Musculoskeletal Back pain?: No Joint pain?: No  Neurological Headaches?: No Dizziness?: No  Psychologic Depression?: No Anxiety?: No  Physical Exam: BP 125/77 (BP Location: Left Arm, Patient Position: Sitting, Cuff Size: Normal)   Pulse 80   Ht 6\' 1"  (1.854 m)   Wt 183 lb 12.8 oz (83.4 kg)   BMI 24.25 kg/m   Constitutional:  Alert and oriented, No acute distress. HEENT: [place] AT, moist mucus  membranes.  Trachea midline, no masses. Cardiovascular: No clubbing, cyanosis, or edema. Respiratory: Normal respiratory effort, no increased work of breathing. GI: Abdomen is soft, nontender, nondistended, no abdominal masses GU: No CVA tenderness.  Penis circumcised; stretched penile length is normal.  Elongated plaque left corpus cavernosum from base to mid shaft.  Testes descended bilateral without masses or tenderness.  Moderate left spermatocele present. Lymph: No cervical or inguinal lymphadenopathy. Skin: No rashes, bruises or suspicious lesions. Neurologic: Grossly intact, no focal deficits, moving all 4 extremities. Psychiatric: Normal mood and affect.   Assessment & Plan:   Based on patient's history of physical exam, findings are consistent with Peyronie's disease.  Pathophysiology was discussed at length including the 2 phases of the diease, acute and chronic.  Treatment options and goals of treatment were discussed today in detail. Options including observation, penile plaque and graft, penile plication, placement of penile prosthesis, and injection of collagenase were all reviewed.  An benefits of each were discussed at length.  We discussed that Xiaflex is administered in cycles which include 2 injections of the medication into the penile plaque followed by modeling procedure with 6 weeks of home exercises. Goals of treatment were reviewed which include improvement of penile curvature ideally to facilitate sexual function/penetration but likely not complete resolution of the curvature. Risks including failure of the medication, penile hematoma/edema, pain, and penile fracture were all discussed. All of his questions were answered.  Would not recommend surgical management or Xiaflex until his coverage has been stable at least 6 months.  In the meantime he was interested in a trial of pentoxifylline and will follow-up in 3 months.   Return in about 3 months (around 11/16/2018) for  Recheck.   Abbie Sons, [place] [address], [place],  [address] [phone]

## 2018-09-01 DIAGNOSIS — G8929 Other chronic pain: Secondary | ICD-10-CM | POA: Diagnosis not present

## 2018-09-01 DIAGNOSIS — M25511 Pain in right shoulder: Secondary | ICD-10-CM | POA: Diagnosis not present

## 2018-09-30 DIAGNOSIS — M7541 Impingement syndrome of right shoulder: Secondary | ICD-10-CM | POA: Diagnosis not present

## 2018-09-30 DIAGNOSIS — M7581 Other shoulder lesions, right shoulder: Secondary | ICD-10-CM | POA: Diagnosis not present

## 2018-10-13 ENCOUNTER — Other Ambulatory Visit: Payer: Self-pay

## 2018-10-13 DIAGNOSIS — N486 Induration penis plastica: Secondary | ICD-10-CM

## 2018-10-13 MED ORDER — PENTOXIFYLLINE ER 400 MG PO TBCR: 400.0000 mg | EXTENDED_RELEASE_TABLET | Freq: Three times a day (TID) | ORAL | 2 refills | Status: DC

## 2018-11-27 ENCOUNTER — Ambulatory Visit: Payer: Medicare PPO | Admitting: Urology

## 2018-12-09 DIAGNOSIS — S46811D Strain of other muscles, fascia and tendons at shoulder and upper arm level, right arm, subsequent encounter: Secondary | ICD-10-CM | POA: Diagnosis not present

## 2018-12-10 DIAGNOSIS — S46811D Strain of other muscles, fascia and tendons at shoulder and upper arm level, right arm, subsequent encounter: Secondary | ICD-10-CM | POA: Diagnosis not present

## 2018-12-17 ENCOUNTER — Ambulatory Visit: Payer: Medicare PPO | Admitting: Urology

## 2019-01-05 DIAGNOSIS — Z1283 Encounter for screening for malignant neoplasm of skin: Secondary | ICD-10-CM | POA: Diagnosis not present

## 2019-01-05 DIAGNOSIS — L820 Inflamed seborrheic keratosis: Secondary | ICD-10-CM | POA: Diagnosis not present

## 2019-01-05 DIAGNOSIS — L249 Irritant contact dermatitis, unspecified cause: Secondary | ICD-10-CM | POA: Diagnosis not present

## 2019-01-05 DIAGNOSIS — Z85828 Personal history of other malignant neoplasm of skin: Secondary | ICD-10-CM | POA: Diagnosis not present

## 2019-01-05 DIAGNOSIS — L821 Other seborrheic keratosis: Secondary | ICD-10-CM | POA: Diagnosis not present

## 2019-01-05 DIAGNOSIS — L570 Actinic keratosis: Secondary | ICD-10-CM | POA: Diagnosis not present

## 2019-01-05 DIAGNOSIS — L578 Other skin changes due to chronic exposure to nonionizing radiation: Secondary | ICD-10-CM | POA: Diagnosis not present

## 2019-01-13 ENCOUNTER — Other Ambulatory Visit: Payer: Self-pay

## 2019-01-13 DIAGNOSIS — N486 Induration penis plastica: Secondary | ICD-10-CM

## 2019-01-19 ENCOUNTER — Encounter: Payer: Self-pay | Admitting: Urology

## 2019-01-19 ENCOUNTER — Ambulatory Visit (INDEPENDENT_AMBULATORY_CARE_PROVIDER_SITE_OTHER): Payer: Medicare PPO | Admitting: Urology

## 2019-01-19 ENCOUNTER — Other Ambulatory Visit: Payer: Self-pay

## 2019-01-19 VITALS — BP 121/70 | HR 81 | Ht 73.0 in | Wt 179.1 lb

## 2019-01-19 DIAGNOSIS — N486 Induration penis plastica: Secondary | ICD-10-CM | POA: Diagnosis not present

## 2019-01-19 NOTE — Progress Notes (Signed)
01/19/2019 2:15 PM   [place] [birth year] [id number]  Referring provider: Margo Common, [place],  [place] [address]  Chief Complaint  Patient presents with  . Abnormal Penile Curvature    HPI: 73 year old male presents for follow-up of Peyronie's disease.  He was initially seen February 2020.  He had penile curvature of 30 degrees to the left at the base without hourglass deformity.  He had only had symptoms for 3 months and was initially given a trial of pentoxifylline.  He has noted mild improvement in the curvature at a little less than 30 degrees.  He has no pain with erections.  He is then [employer].  PMH: Past Medical History:  Diagnosis Date  . History of nephrolithiasis   . History of vein stripping   . Kidney stones   . Mitral valve regurgitation   . Seizure ([place])    6 months ago while sleeping  . Thrombophlebitis of lower extremities    left    Surgical History: Past Surgical History:  Procedure Laterality Date  . CARDIAC CATHETERIZATION  01/05/08  . COLONOSCOPY  2005  . endocardiogram  2009  . heart valve repair  2009  . VARICOSE VEIN SURGERY     vein strip left leg    Home Medications:  Allergies as of 01/19/2019      Reactions   Penicillins Other (See Comments)   Unknown reaction, childhood reaction      Medication List       Accurate as of January 19, 2019  2:15 PM. If you have any questions, ask your nurse or doctor.        STOP taking these medications   RED YEAST RICE PO Stopped by: Abbie Sons, MD     TAKE these medications   levETIRAcetam 500 MG tablet Commonly known as: KEPPRA Take 1 tablet by mouth 2 (two) times daily.   meloxicam 7.5 MG tablet Commonly known as: MOBIC   multivitamin tablet Take 1 tablet by mouth daily.   pentoxifylline 400 MG CR tablet Commonly known as: TRENTAL Take 1 tablet (400 mg total) by mouth 3 (three) times daily with meals.   RA Fish Oil 1000 MG Caps Take 1 capsule  by mouth 2 (two) times daily.   sildenafil 20 MG tablet Commonly known as: REVATIO TAKE 5 TABLETS BY MOUTH AS NEEDED       Allergies:  Allergies  Allergen Reactions  . Penicillins Other (See Comments)    Unknown reaction, childhood reaction    Family History: No family history on file.  Social History:  reports that he has quit smoking. He has never used smokeless tobacco. He reports current alcohol use. He reports that he does not use drugs.  ROS: UROLOGY Frequent Urination?: No Hard to postpone urination?: No Burning/pain with urination?: No Get up at night to urinate?: No Leakage of urine?: No Urine stream starts and stops?: No Trouble starting stream?: No Do you have to strain to urinate?: No Blood in urine?: No Urinary tract infection?: No Sexually transmitted disease?: No Injury to kidneys or bladder?: No Painful intercourse?: No Weak stream?: No Erection problems?: No Penile pain?: No  Gastrointestinal Nausea?: No Vomiting?: No Indigestion/heartburn?: No Diarrhea?: No Constipation?: No  Constitutional Fever: No Night sweats?: No Weight loss?: No Fatigue?: No  Skin Skin rash/lesions?: No Itching?: No  Eyes Blurred vision?: No Double vision?: No  Ears/Nose/Throat Sore throat?: No Sinus problems?: No  Hematologic/Lymphatic Swollen glands?: No Easy  bruising?: No  Cardiovascular Leg swelling?: No Chest pain?: No  Respiratory Cough?: No Shortness of breath?: No  Endocrine Excessive thirst?: No  Musculoskeletal Back pain?: No Joint pain?: No  Neurological Headaches?: No Dizziness?: No  Psychologic Depression?: No Anxiety?: No  Physical Exam: BP 121/70 (BP Location: Left Arm, Patient Position: Sitting, Cuff Size: Normal)   Pulse 81   Ht 6\' 1"  (1.854 m)   Wt 179 lb 1.6 oz (81.2 kg)   BMI 23.63 kg/m   Constitutional:  Alert and oriented, No acute distress.    Assessment & Plan:   73 year old male with Peyronie's.  He  desires to start Xiaflex and will submit insurance authorization.  I again reviewed a treatment cycle and potential risks of bleeding, bruising, infection.  It was stressed he will need to be abstinent for 4 weeks after injection due to the risk of penile fracture.  He was also informed that he will most likely need more than 1 treatment cycle.   Abbie Sons, [place] [address] [place], [place] [address] [phone]

## 2019-01-28 ENCOUNTER — Telehealth: Payer: Self-pay | Admitting: Urology

## 2019-01-28 NOTE — Telephone Encounter (Signed)
Patient called asking about Xiaflex PA I explained the process of getting the PA and that we would need him to come in and see me to sign th ePA form and that once that once submitted and approved we would submit a PA for the medication and he would be required to pay for that up front. He has decided to decline moving forward with the treatment at this time.    Sharyn Lull

## 2019-01-28 NOTE — Telephone Encounter (Signed)
Unlikely Medicare would cover [facility]

## 2019-04-19 IMAGING — DX DG CHEST 2V
2 series · 2 of 2 positions shown · non-contrast
Comparison: Chest x-ray of April 01, 2011

CLINICAL DATA: Less post mitral valve repair 10 years ago. No
current complaints. Former smoker.

EXAM:
CHEST  2 VIEW

[dg chest 2 view (1 of 2)]
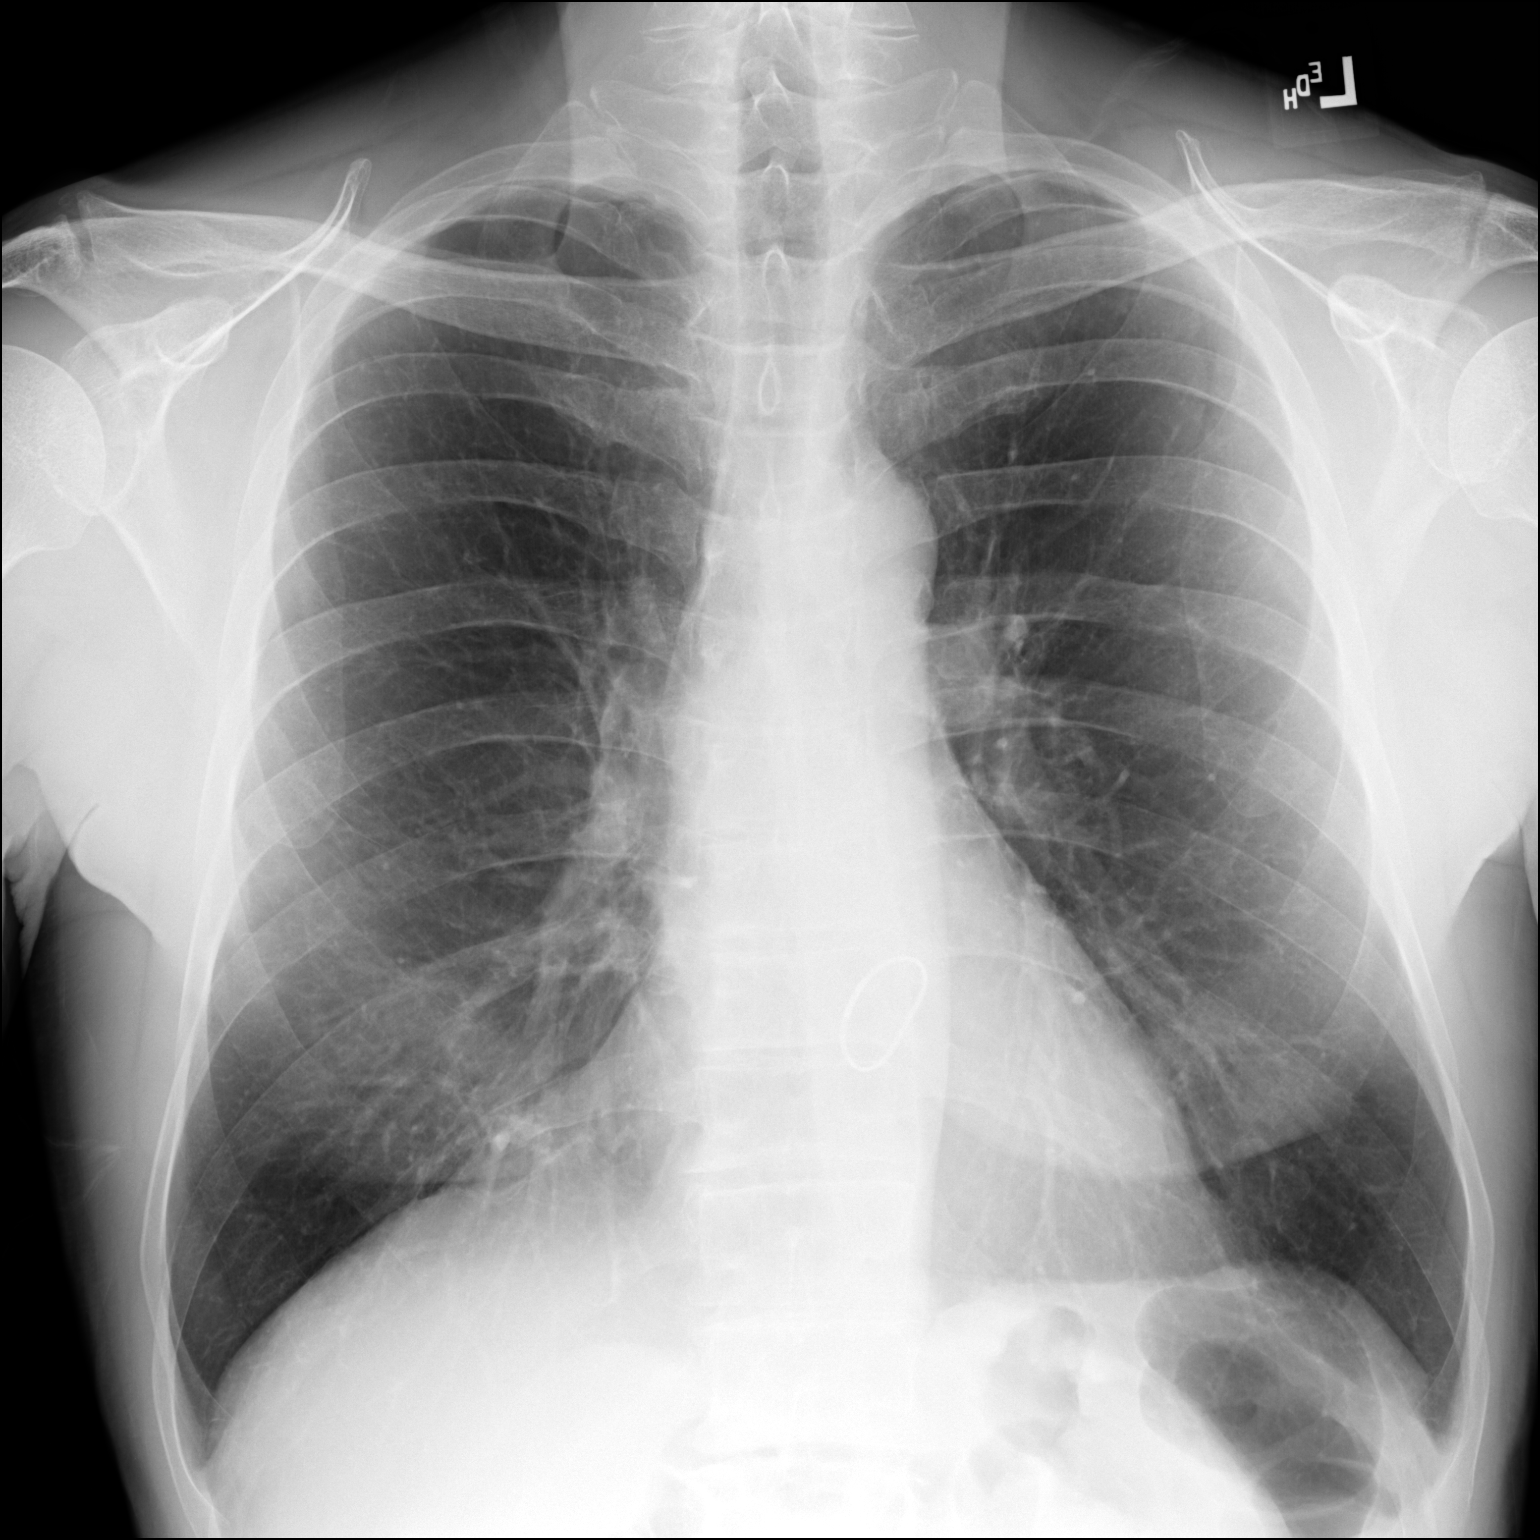

[dg chest 2 view (2 of 2)]
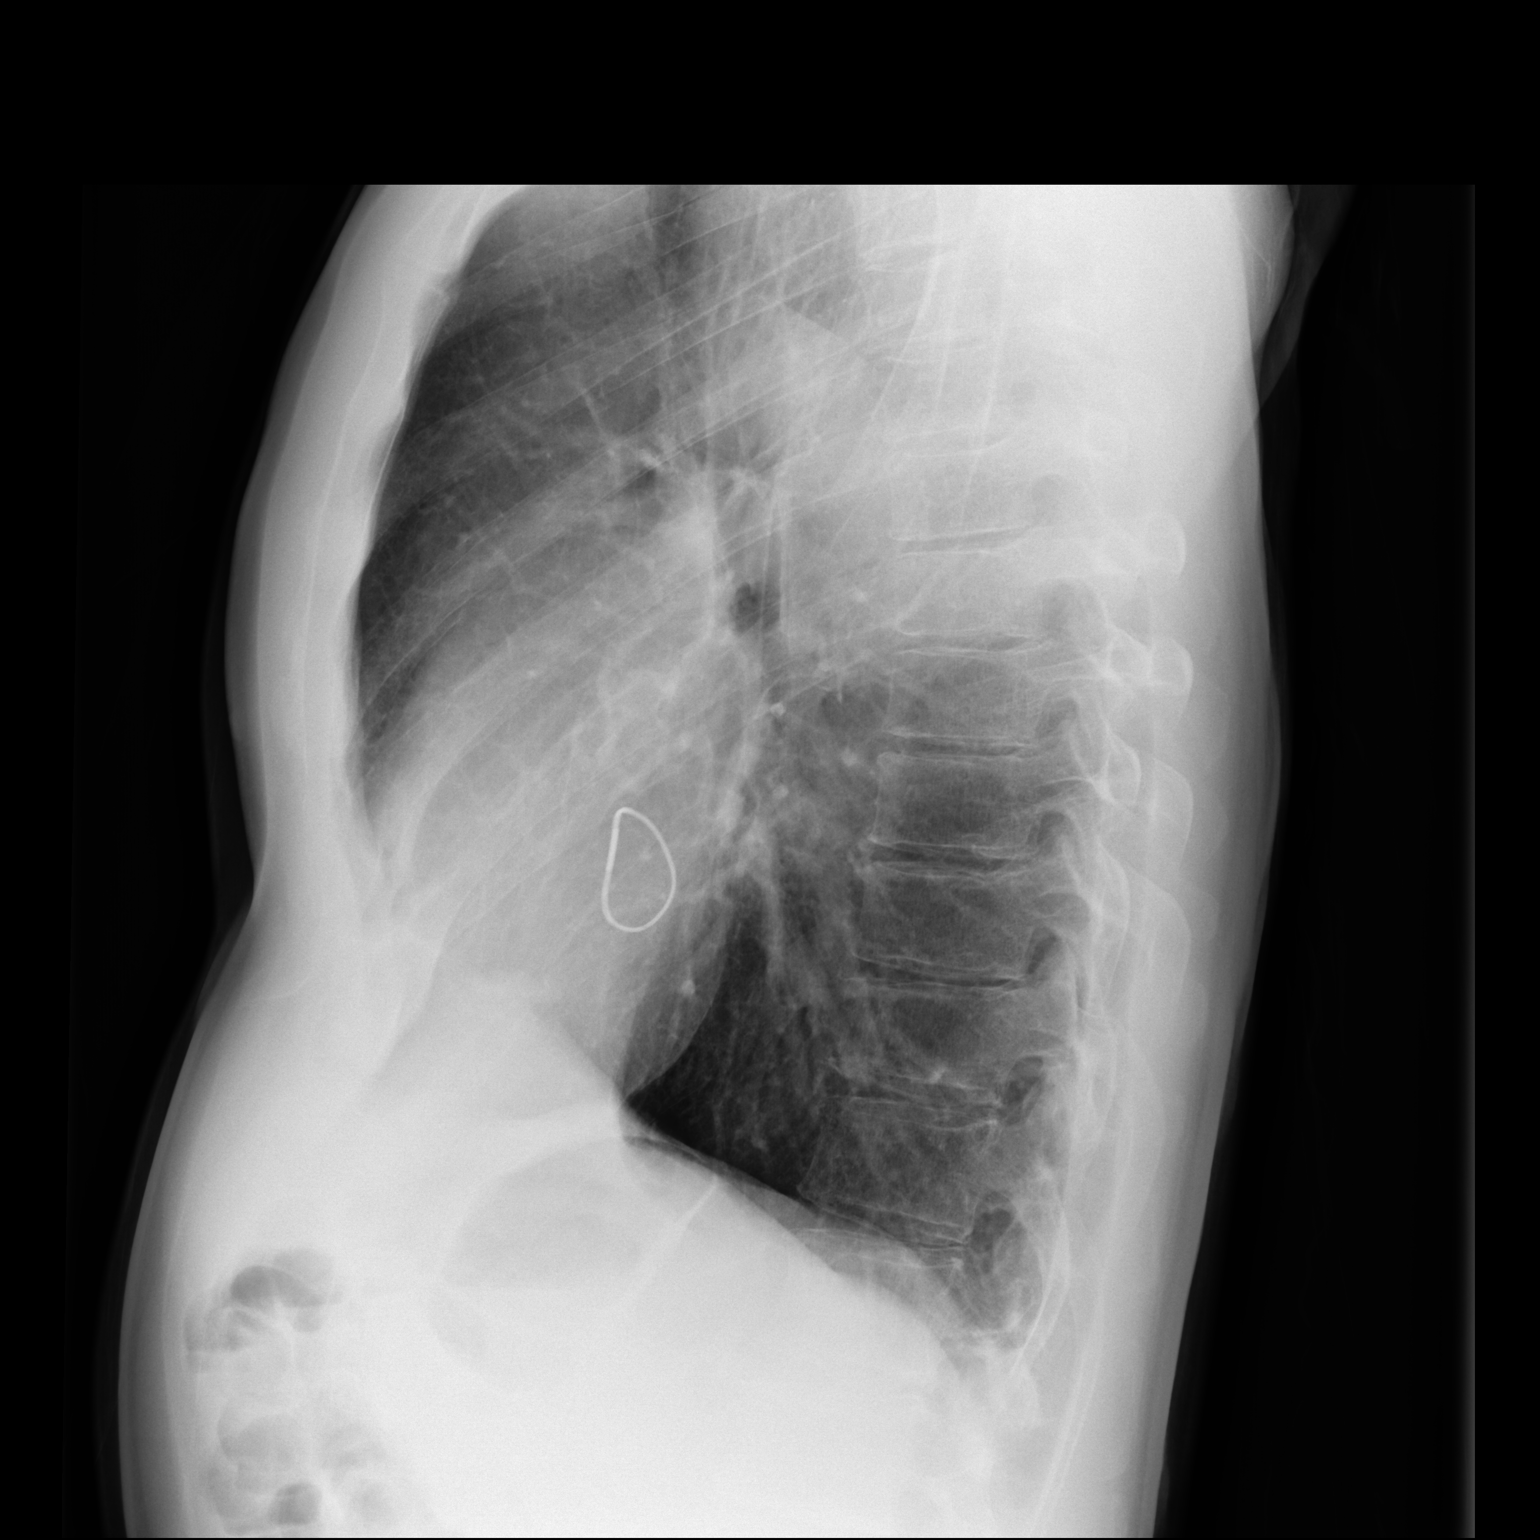

[2 of 2 positions shown; findings below may reference images not displayed]

FINDINGS: The lungs are hyperinflated with hemidiaphragm flattening. There is
no focal infiltrate. There is no pleural effusion. There are no
pulmonary parenchymal nodules or masses. The heart and pulmonary
vascularity are normal. A prosthetic mitral valve ring is present.
There is calcification in the wall of the aortic arch. The bony
thorax is unremarkable.
IMPRESSION: Hyperinflation consistent with reactive airway disease or COPD. No
pneumonia, CHF, nor other acute cardiopulmonary abnormality.

Thoracic aortic atherosclerosis.

## 2019-06-03 ENCOUNTER — Encounter: Payer: Self-pay | Admitting: Urology

## 2019-06-21 ENCOUNTER — Encounter: Payer: Self-pay | Admitting: Family Medicine

## 2019-07-05 ENCOUNTER — Ambulatory Visit: Payer: Medicare PPO

## 2019-07-05 ENCOUNTER — Encounter: Payer: Medicare PPO | Admitting: Family Medicine

## 2019-07-13 DIAGNOSIS — L820 Inflamed seborrheic keratosis: Secondary | ICD-10-CM | POA: Diagnosis not present

## 2019-07-13 DIAGNOSIS — D2239 Melanocytic nevi of other parts of face: Secondary | ICD-10-CM | POA: Diagnosis not present

## 2019-07-13 DIAGNOSIS — Z86018 Personal history of other benign neoplasm: Secondary | ICD-10-CM | POA: Diagnosis not present

## 2019-07-13 DIAGNOSIS — D2262 Melanocytic nevi of left upper limb, including shoulder: Secondary | ICD-10-CM | POA: Diagnosis not present

## 2019-07-13 DIAGNOSIS — L905 Scar conditions and fibrosis of skin: Secondary | ICD-10-CM | POA: Diagnosis not present

## 2019-07-13 DIAGNOSIS — L738 Other specified follicular disorders: Secondary | ICD-10-CM | POA: Diagnosis not present

## 2019-07-13 DIAGNOSIS — Z85828 Personal history of other malignant neoplasm of skin: Secondary | ICD-10-CM | POA: Diagnosis not present

## 2019-07-13 DIAGNOSIS — L570 Actinic keratosis: Secondary | ICD-10-CM | POA: Diagnosis not present

## 2019-07-13 DIAGNOSIS — L821 Other seborrheic keratosis: Secondary | ICD-10-CM | POA: Diagnosis not present

## 2019-07-16 ENCOUNTER — Ambulatory Visit: Payer: Medicare PPO | Admitting: Urology

## 2019-07-16 ENCOUNTER — Other Ambulatory Visit: Payer: Self-pay

## 2019-07-16 ENCOUNTER — Encounter: Payer: Self-pay | Admitting: Urology

## 2019-07-16 ENCOUNTER — Other Ambulatory Visit: Payer: Self-pay | Admitting: Radiology

## 2019-07-16 VITALS — BP 125/81 | HR 79 | Ht 73.0 in | Wt 181.7 lb

## 2019-07-16 DIAGNOSIS — N4340 Spermatocele of epididymis, unspecified: Secondary | ICD-10-CM

## 2019-07-16 NOTE — H&P (View-Only) (Signed)
07/16/2019 3:39 PM   Scott Potts Apr 02, 1946 [id number]  Referring provider: Margo Common, [place],  [place] [address]  Chief Complaint  Patient presents with  . Cyst    HPI: 74 y.o. male with a history of left spermatocele states over the last 1-2 years he has noted gradual enlargement and has requested excision.  He has mild discomfort with changes in position.    He was previously seen for Peyronie's disease and was considering Xiaflex however elected not to pursue.  Denies bothersome lower urinary tract symptoms.   PMH: Past Medical History:  Diagnosis Date  . History of nephrolithiasis   . History of vein stripping   . Kidney stones   . Mitral valve regurgitation   . Seizure ([place])    6 months ago while sleeping  . Thrombophlebitis of lower extremities    left    Surgical History: Past Surgical History:  Procedure Laterality Date  . CARDIAC CATHETERIZATION  01/05/08  . COLONOSCOPY  2005  . endocardiogram  2009  . heart valve repair  2009  . VARICOSE VEIN SURGERY     vein strip left leg    Home Medications:  Allergies as of 07/16/2019      Reactions   Penicillins Other (See Comments)   Unknown reaction, childhood reaction      Medication List       Accurate as of July 16, 2019  3:39 PM. If you have any questions, ask your nurse or doctor.        STOP taking these medications   pentoxifylline 400 MG CR tablet Commonly known as: TRENTAL Stopped by: Abbie Sons, MD     TAKE these medications   levETIRAcetam 500 MG tablet Commonly known as: KEPPRA Take 1 tablet by mouth 2 (two) times daily.   meloxicam 7.5 MG tablet Commonly known as: MOBIC   multivitamin tablet Take 1 tablet by mouth daily.   RA Fish Oil 1000 MG Caps Take 1 capsule by mouth 2 (two) times daily.   sildenafil 20 MG tablet Commonly known as: REVATIO TAKE 5 TABLETS BY MOUTH AS NEEDED       Allergies:  Allergies  Allergen Reactions  .  Penicillins Other (See Comments)    Unknown reaction, childhood reaction    Family History: No family history on file.  Social History:  reports that he has quit smoking. He has never used smokeless tobacco. He reports current alcohol use. He reports that he does not use drugs.  ROS: UROLOGY Frequent Urination?: No Hard to postpone urination?: No Burning/pain with urination?: No Get up at night to urinate?: No Leakage of urine?: No Urine stream starts and stops?: No Trouble starting stream?: No Do you have to strain to urinate?: No Blood in urine?: No Urinary tract infection?: No Sexually transmitted disease?: No Injury to kidneys or bladder?: No Painful intercourse?: No Weak stream?: No Erection problems?: No Penile pain?: No  Gastrointestinal Nausea?: No Vomiting?: No Indigestion/heartburn?: No Diarrhea?: No Constipation?: No  Constitutional Fever: No Night sweats?: No Weight loss?: No Fatigue?: No  Skin Skin rash/lesions?: No Itching?: No  Eyes Blurred vision?: No Double vision?: No  Ears/Nose/Throat Sore throat?: No Sinus problems?: No  Hematologic/Lymphatic Swollen glands?: No Easy bruising?: No  Cardiovascular Leg swelling?: No Chest pain?: No  Respiratory Cough?: No Shortness of breath?: No  Endocrine Excessive thirst?: No  Musculoskeletal Back pain?: No Joint pain?: No  Neurological Headaches?: No Dizziness?: No  Psychologic  Depression?: No Anxiety?: No  Physical Exam: BP 125/81 (BP Location: Left Arm, Patient Position: Sitting, Cuff Size: Normal)   Pulse 79   Ht 6\' 1"  (1.854 m)   Wt 181 lb 11.2 oz (82.4 kg)   BMI 23.97 kg/m   Constitutional:  Alert and oriented, No acute distress. HEENT: [place] AT, moist mucus membranes.  Trachea midline, no masses. Cardiovascular: No clubbing, cyanosis, or edema. Respiratory: Normal respiratory effort, no increased work of breathing. GI: Abdomen is soft, nontender, nondistended, no  abdominal masses GU: Phallus circumcised.  Testes descended bilateral without masses or tenderness. ~5 cm transilluminating supratesticular mass consistent with a spermatocele. Skin: No rashes, bruises or suspicious lesions. Neurologic: Grossly intact, no focal deficits, moving all 4 extremities. Psychiatric: Normal mood and affect.   Assessment & Plan:    - Left spermatocele Relatively large left spermatocele and he would like to proceed with spermatocelectomy.  The procedure was discussed including potential risks of bleeding/hematoma, infection/abscess and the possibility of additional surgery for complications.  The postoperative care and follow-up were discussed including the potential need for a scrotal drain.  He indicated all questions were answered and desires to schedule.    Abbie Sons, [place] [address] [place], [place] [address] [phone]

## 2019-07-16 NOTE — Progress Notes (Signed)
07/16/2019 3:39 PM   Scott Potts 03/16/1946 [id number]  Referring provider: Margo Common, [place],   [address]  Chief Complaint  Patient presents with  . Cyst    HPI: 74 y.o. male with a history of left spermatocele states over the last 1-2 years he has noted gradual enlargement and has requested excision.  He has mild discomfort with changes in position.    He was previously seen for Peyronie's disease and was considering Xiaflex however elected not to pursue.  Denies bothersome lower urinary tract symptoms.   PMH: Past Medical History:  Diagnosis Date  . History of nephrolithiasis   . History of vein stripping   . Kidney stones   . Mitral valve regurgitation   . Seizure ([place])    6 months ago while sleeping  . Thrombophlebitis of lower extremities    left    Surgical History: Past Surgical History:  Procedure Laterality Date  . CARDIAC CATHETERIZATION  01/05/08  . COLONOSCOPY  2005  . endocardiogram  2009  . heart valve repair  2009  . VARICOSE VEIN SURGERY     vein strip left leg    Home Medications:  Allergies as of 07/16/2019      Reactions   Penicillins Other (See Comments)   Unknown reaction, childhood reaction      Medication List       Accurate as of July 16, 2019  3:39 PM. If you have any questions, ask your nurse or doctor.        STOP taking these medications   pentoxifylline 400 MG CR tablet Commonly known as: TRENTAL Stopped by: Abbie Sons, MD     TAKE these medications   levETIRAcetam 500 MG tablet Commonly known as: KEPPRA Take 1 tablet by mouth 2 (two) times daily.   meloxicam 7.5 MG tablet Commonly known as: MOBIC   multivitamin tablet Take 1 tablet by mouth daily.   RA Fish Oil 1000 MG Caps Take 1 capsule by mouth 2 (two) times daily.   sildenafil 20 MG tablet Commonly known as: REVATIO TAKE 5 TABLETS BY MOUTH AS NEEDED       Allergies:  Allergies  Allergen Reactions  .  Penicillins Other (See Comments)    Unknown reaction, childhood reaction    Family History: No family history on file.  Social History:  reports that he has quit smoking. He has never used smokeless tobacco. He reports current alcohol use. He reports that he does not use drugs.  ROS: UROLOGY Frequent Urination?: No Hard to postpone urination?: No Burning/pain with urination?: No Get up at night to urinate?: No Leakage of urine?: No Urine stream starts and stops?: No Trouble starting stream?: No Do you have to strain to urinate?: No Blood in urine?: No Urinary tract infection?: No Sexually transmitted disease?: No Injury to kidneys or bladder?: No Painful intercourse?: No Weak stream?: No Erection problems?: No Penile pain?: No  Gastrointestinal Nausea?: No Vomiting?: No Indigestion/heartburn?: No Diarrhea?: No Constipation?: No  Constitutional Fever: No Night sweats?: No Weight loss?: No Fatigue?: No  Skin Skin rash/lesions?: No Itching?: No  Eyes Blurred vision?: No Double vision?: No  Ears/Nose/Throat Sore throat?: No Sinus problems?: No  Hematologic/Lymphatic Swollen glands?: No Easy bruising?: No  Cardiovascular Leg swelling?: No Chest pain?: No  Respiratory Cough?: No Shortness of breath?: No  Endocrine Excessive thirst?: No  Musculoskeletal Back pain?: No Joint pain?: No  Neurological Headaches?: No Dizziness?: No  Psychologic  Depression?: No Anxiety?: No  Physical Exam: BP 125/81 (BP Location: Left Arm, Patient Position: Sitting, Cuff Size: Normal)   Pulse 79   Ht 6\' 1"  (1.854 m)   Wt 181 lb 11.2 oz (82.4 kg)   BMI 23.97 kg/m   Constitutional:  Alert and oriented, No acute distress. HEENT: [place] AT, moist mucus membranes.  Trachea midline, no masses. Cardiovascular: No clubbing, cyanosis, or edema. Respiratory: Normal respiratory effort, no increased work of breathing. GI: Abdomen is soft, nontender, nondistended, no  abdominal masses GU: Phallus circumcised.  Testes descended bilateral without masses or tenderness. ~5 cm transilluminating supratesticular mass consistent with a spermatocele. Skin: No rashes, bruises or suspicious lesions. Neurologic: Grossly intact, no focal deficits, moving all 4 extremities. Psychiatric: Normal mood and affect.   Assessment & Plan:    - Left spermatocele Relatively large left spermatocele and he would like to proceed with spermatocelectomy.  The procedure was discussed including potential risks of bleeding/hematoma, infection/abscess and the possibility of additional surgery for complications.  The postoperative care and follow-up were discussed including the potential need for a scrotal drain.  He indicated all questions were answered and desires to schedule.    Abbie Sons, [place] [address] [place], [place] [address] [phone]

## 2019-07-19 ENCOUNTER — Encounter: Payer: Medicare PPO | Admitting: Family Medicine

## 2019-07-19 ENCOUNTER — Encounter: Payer: Self-pay | Admitting: Urology

## 2019-07-19 NOTE — Telephone Encounter (Signed)
Patient informed of instructions and appointments regarding surgery. Questions answered. Patient expresses understanding of conversation.

## 2019-07-21 ENCOUNTER — Encounter
Admission: RE | Admit: 2019-07-21 | Discharge: 2019-07-21 | Disposition: A | Discharge status: Home / Self Care | Payer: Medicare PPO | Source: Ambulatory Visit | Attending: Urology | Admitting: Urology

## 2019-07-21 ENCOUNTER — Other Ambulatory Visit: Payer: Self-pay

## 2019-07-21 HISTORY — DX: Personal history of urinary calculi: Z87.442

## 2019-07-21 NOTE — Patient Instructions (Signed)
Your procedure is scheduled on: 07/27/19 Report to [facility]. To find out your arrival time please call [phone] between 1PM - 3PM on 07/26/19.  Remember: Instructions that are not followed completely may result in serious medical risk, up to and including death, or upon the discretion of your surgeon and anesthesiologist your surgery may need to be rescheduled.     _X__ 1. Do not eat food after midnight the night before your procedure.                 No gum chewing or hard candies. You may drink clear liquids up to 2 hours                 before you are scheduled to arrive for your surgery- DO not drink clear                 liquids within 2 hours of the start of your surgery.                 Clear Liquids include:  water, apple juice without pulp, clear carbohydrate                 drink such as Clearfast or Gatorade, Black Coffee or Tea (Do not add                 anything to coffee or tea). Diabetics water only  __X__2.  On the morning of surgery brush your teeth with toothpaste and water, you                 may rinse your mouth with mouthwash if you wish.  Do not swallow any              toothpaste of mouthwash.     _X__ 3.  No Alcohol for 24 hours before or after surgery.   _X__ 4.  Do Not Smoke or use e-cigarettes For 24 Hours Prior to Your Surgery.                 Do not use any chewable tobacco products for at least 6 hours prior to                 surgery.  ____  5.  Bring all medications with you on the day of surgery if instructed.   __X__  6.  Notify your doctor if there is any change in your medical condition      (cold, fever, infections).     Do not wear jewelry, make-up, hairpins, clips or nail polish. Do not wear lotions, powders, or perfumes.  Do not shave 48 hours prior to surgery. Men may shave face and neck. Do not bring valuables to the hospital.    [facility] is not responsible for any belongings or  valuables.  Contacts, dentures/partials or body piercings may not be worn into surgery. Bring a case for your contacts, glasses or hearing aids, a denture cup will be supplied. Leave your suitcase in the car. After surgery it may be brought to your room. For patients admitted to the hospital, discharge time is determined by your treatment team.   Patients discharged the day of surgery will not be allowed to drive home.   Please read over the following fact sheets that you were given:   MRSA Information  __X__ Take these medicines the morning of surgery with A SIP OF WATER:  1. Keppra  2.   3.   4.  5.  6.  ____ Fleet Enema (as directed)   ____ Use CHG Soap/SAGE wipes as directed  ____ Use inhalers on the day of surgery  ____ Stop metformin/Janumet/Farxiga 2 days prior to surgery    ____ Take 1/2 of usual insulin dose the night before surgery. No insulin the morning          of surgery.   ____ Stop Blood Thinners Coumadin/Plavix/Xarelto/Pleta/Pradaxa/Eliquis/Effient/Aspirin  on   Or contact your Surgeon, Cardiologist or Medical Doctor regarding  ability to stop your blood thinners  __X__ Stop Anti-inflammatories 7 days before surgery such as Advil, Ibuprofen, Motrin,  BC or Goodies Powder, Naprosyn, Naproxen, Aleve, Aspirin    __X__ Stop all herbal supplements, fish oil or vitamin E until after surgery.    ____ Bring C-Pap to the hospital.    Shower with something like Dial soap if you have it the morning of surgery.

## 2019-07-22 ENCOUNTER — Encounter
Admission: RE | Admit: 2019-07-22 | Discharge: 2019-07-22 | Disposition: A | Discharge status: Home / Self Care | Payer: Medicare PPO | Source: Ambulatory Visit | Attending: Urology | Admitting: Urology

## 2019-07-22 DIAGNOSIS — Z01810 Encounter for preprocedural cardiovascular examination: Secondary | ICD-10-CM | POA: Diagnosis not present

## 2019-07-22 DIAGNOSIS — I10 Essential (primary) hypertension: Secondary | ICD-10-CM | POA: Diagnosis not present

## 2019-07-22 DIAGNOSIS — Z01818 Encounter for other preprocedural examination: Secondary | ICD-10-CM | POA: Diagnosis not present

## 2019-07-22 LAB — CBC
HCT: 37.9 % — ABNORMAL LOW (ref 39.0–52.0)
Hemoglobin: 12.6 g/dL — ABNORMAL LOW (ref 13.0–17.0)
MCH: 29.4 pg (ref 26.0–34.0)
MCHC: 33.2 g/dL (ref 30.0–36.0)
MCV: 88.3 fL (ref 80.0–100.0)
Platelets: 239 10*3/uL (ref 150–400)
RBC: 4.29 MIL/uL (ref 4.22–5.81)
RDW: 13.5 % (ref 11.5–15.5)
WBC: 5.1 10*3/uL (ref 4.0–10.5)
nRBC: 0 % (ref 0.0–0.2)

## 2019-07-22 NOTE — Pre-Procedure Instructions (Signed)
Dr. Ronelle Nigh notified of EKG findings.  OK to proceed without further studies.

## 2019-07-23 ENCOUNTER — Other Ambulatory Visit
Admission: RE | Admit: 2019-07-23 | Discharge: 2019-07-23 | Disposition: A | Discharge status: Home / Self Care | Payer: Medicare PPO | Source: Ambulatory Visit | Attending: Urology | Admitting: Urology

## 2019-07-23 ENCOUNTER — Other Ambulatory Visit: Payer: Self-pay

## 2019-07-23 DIAGNOSIS — Z20822 Contact with and (suspected) exposure to covid-19: Secondary | ICD-10-CM | POA: Diagnosis not present

## 2019-07-23 DIAGNOSIS — Z01812 Encounter for preprocedural laboratory examination: Secondary | ICD-10-CM | POA: Diagnosis not present

## 2019-07-24 LAB — SARS CORONAVIRUS 2 (TAT 6-24 HRS): SARS Coronavirus 2: NEGATIVE

## 2019-07-27 ENCOUNTER — Ambulatory Visit: Payer: Medicare PPO | Admitting: Family

## 2019-07-27 ENCOUNTER — Encounter
Admission: RE | Disposition: A | Discharge status: Home / Self Care | Payer: Self-pay | Source: Ambulatory Visit | Attending: Urology

## 2019-07-27 ENCOUNTER — Encounter: Payer: Self-pay | Admitting: Urology

## 2019-07-27 ENCOUNTER — Other Ambulatory Visit: Payer: Self-pay

## 2019-07-27 ENCOUNTER — Ambulatory Visit
Admission: RE | Admit: 2019-07-27 | Discharge: 2019-07-27 | Disposition: A | Discharge status: Home / Self Care | Payer: Medicare PPO | Source: Ambulatory Visit | Attending: Urology | Admitting: Urology

## 2019-07-27 ENCOUNTER — Telehealth: Payer: Self-pay | Admitting: Urology

## 2019-07-27 DIAGNOSIS — R569 Unspecified convulsions: Secondary | ICD-10-CM | POA: Diagnosis not present

## 2019-07-27 DIAGNOSIS — N4341 Spermatocele of epididymis, single: Secondary | ICD-10-CM | POA: Diagnosis not present

## 2019-07-27 DIAGNOSIS — N4340 Spermatocele of epididymis, unspecified: Secondary | ICD-10-CM

## 2019-07-27 DIAGNOSIS — Z87442 Personal history of urinary calculi: Secondary | ICD-10-CM | POA: Diagnosis not present

## 2019-07-27 DIAGNOSIS — N501 Vascular disorders of male genital organs: Secondary | ICD-10-CM | POA: Diagnosis not present

## 2019-07-27 DIAGNOSIS — I803 Phlebitis and thrombophlebitis of lower extremities, unspecified: Secondary | ICD-10-CM | POA: Insufficient documentation

## 2019-07-27 DIAGNOSIS — Z87891 Personal history of nicotine dependence: Secondary | ICD-10-CM | POA: Diagnosis not present

## 2019-07-27 DIAGNOSIS — Z880 Allergy status to penicillin: Secondary | ICD-10-CM | POA: Diagnosis not present

## 2019-07-27 HISTORY — PX: SPERMATOCELECTOMY: SHX2420

## 2019-07-27 SURGERY — EXCISION, SPERMATOCELE
Anesthesia: General | Laterality: Left

## 2019-07-27 MED ORDER — FAMOTIDINE 20 MG PO TABS: 20.0000 mg | ORAL_TABLET | Freq: Once | ORAL | Status: AC

## 2019-07-27 MED ORDER — ONDANSETRON HCL 4 MG/2ML IJ SOLN: 4.0000 mg | Freq: Once | INTRAMUSCULAR | Status: DC | PRN

## 2019-07-27 MED ORDER — LACTATED RINGERS IV SOLN: INTRAVENOUS | Status: DC

## 2019-07-27 MED ORDER — BACITRACIN 500 UNIT/GM OP OINT
TOPICAL_OINTMENT | OPHTHALMIC | Status: AC
  Filled 2019-07-27: qty 3.5

## 2019-07-27 MED ORDER — DEXAMETHASONE SODIUM PHOSPHATE 10 MG/ML IJ SOLN
INTRAMUSCULAR | Status: DC | PRN
  Administered 2019-07-27: 10 mg via INTRAVENOUS

## 2019-07-27 MED ORDER — MIDAZOLAM HCL 2 MG/2ML IJ SOLN
INTRAMUSCULAR | Status: AC
  Filled 2019-07-27: qty 2

## 2019-07-27 MED ORDER — FENTANYL CITRATE (PF) 100 MCG/2ML IJ SOLN
INTRAMUSCULAR | Status: DC | PRN
  Administered 2019-07-27 (×2): 25 ug via INTRAVENOUS

## 2019-07-27 MED ORDER — FENTANYL CITRATE (PF) 100 MCG/2ML IJ SOLN
INTRAMUSCULAR | Status: AC
  Filled 2019-07-27: qty 2

## 2019-07-27 MED ORDER — ONDANSETRON HCL 4 MG/2ML IJ SOLN
INTRAMUSCULAR | Status: AC
  Filled 2019-07-27: qty 2

## 2019-07-27 MED ORDER — HYDROCODONE-ACETAMINOPHEN 5-325 MG PO TABS: 1.0000 | ORAL_TABLET | ORAL | 0 refills | Status: DC | PRN

## 2019-07-27 MED ORDER — MIDAZOLAM HCL 2 MG/2ML IJ SOLN
INTRAMUSCULAR | Status: DC | PRN
  Administered 2019-07-27: 2 mg via INTRAVENOUS

## 2019-07-27 MED ORDER — HYDROCODONE-ACETAMINOPHEN 5-325 MG PO TABS: 1.0000 | ORAL_TABLET | ORAL | Status: DC | PRN

## 2019-07-27 MED ORDER — BUPIVACAINE HCL 0.25 % IJ SOLN
INTRAMUSCULAR | Status: DC | PRN
  Administered 2019-07-27: 3.5 mL

## 2019-07-27 MED ORDER — HYDROCODONE-ACETAMINOPHEN 5-325 MG PO TABS
ORAL_TABLET | ORAL | Status: AC
  Administered 2019-07-27: 1 via ORAL
  Filled 2019-07-27: qty 1

## 2019-07-27 MED ORDER — FENTANYL CITRATE (PF) 100 MCG/2ML IJ SOLN: 25.0000 ug | INTRAMUSCULAR | Status: DC | PRN

## 2019-07-27 MED ORDER — PROPOFOL 10 MG/ML IV BOLUS
INTRAVENOUS | Status: DC | PRN
  Administered 2019-07-27: 140 mg via INTRAVENOUS

## 2019-07-27 MED ORDER — DEXAMETHASONE SODIUM PHOSPHATE 10 MG/ML IJ SOLN
INTRAMUSCULAR | Status: AC
  Filled 2019-07-27: qty 1

## 2019-07-27 MED ORDER — BACITRACIN ZINC 500 UNIT/GM EX OINT
TOPICAL_OINTMENT | CUTANEOUS | Status: AC
  Filled 2019-07-27: qty 28.35

## 2019-07-27 MED ORDER — FAMOTIDINE 20 MG PO TABS
ORAL_TABLET | ORAL | Status: AC
  Administered 2019-07-27: 20 mg via ORAL
  Filled 2019-07-27: qty 1

## 2019-07-27 MED ORDER — ONDANSETRON HCL 4 MG/2ML IJ SOLN
INTRAMUSCULAR | Status: DC | PRN
  Administered 2019-07-27: 4 mg via INTRAVENOUS

## 2019-07-27 MED ORDER — PROPOFOL 10 MG/ML IV BOLUS
INTRAVENOUS | Status: AC
  Filled 2019-07-27: qty 40

## 2019-07-27 MED ORDER — SULFAMETHOXAZOLE-TRIMETHOPRIM 800-160 MG PO TABS: 1.0000 | ORAL_TABLET | Freq: Two times a day (BID) | ORAL | 0 refills | Status: AC

## 2019-07-27 MED ORDER — LIDOCAINE HCL (PF) 2 % IJ SOLN
INTRAMUSCULAR | Status: AC
  Filled 2019-07-27: qty 5

## 2019-07-27 SURGICAL SUPPLY — 37 items
APL PRP STRL LF DISP 70% ISPRP (MISCELLANEOUS) ×1
BLADE SURG 15 STRL LF DISP TIS (BLADE) ×1 IMPLANT
BLADE SURG 15 STRL SS (BLADE) ×3
BNDG CONFORM 3 STRL LF (GAUZE/BANDAGES/DRESSINGS) ×2 IMPLANT
CANISTER SUCT 1200ML W/VALVE (MISCELLANEOUS) ×3 IMPLANT
CHLORAPREP W/TINT 26 (MISCELLANEOUS) ×3 IMPLANT
COVER WAND RF STERILE (DRAPES) ×3 IMPLANT
DRAIN PENROSE 1/4X12 LTX STRL (WOUND CARE) ×2 IMPLANT
DRAIN PENROSE 5/8X18 LTX STRL (WOUND CARE) ×1 IMPLANT
DRAPE LAPAROTOMY 77X122 PED (DRAPES) ×3 IMPLANT
DRSG GAUZE FLUFF 36X18 (GAUZE/BANDAGES/DRESSINGS) ×2 IMPLANT
ELECT CAUTERY NEEDLE TIP 1.0 (MISCELLANEOUS) ×3
ELECT REM PT RETURN 9FT ADLT (ELECTROSURGICAL) ×3
ELECTRODE CAUTERY NEDL TIP 1.0 (MISCELLANEOUS) ×1 IMPLANT
ELECTRODE REM PT RTRN 9FT ADLT (ELECTROSURGICAL) ×1 IMPLANT
GAUZE SPONGE 4X4 12PLY STRL (GAUZE/BANDAGES/DRESSINGS) ×3 IMPLANT
GLOVE BIO SURGEON STRL SZ8 (GLOVE) ×3 IMPLANT
GOWN STRL REUS W/ TWL LRG LVL3 (GOWN DISPOSABLE) ×1 IMPLANT
GOWN STRL REUS W/TWL LRG LVL3 (GOWN DISPOSABLE) ×3
GOWN STRL REUS W/TWL XL LVL3 (GOWN DISPOSABLE) ×3 IMPLANT
KIT TURNOVER KIT A (KITS) ×3 IMPLANT
LABEL OR SOLS (LABEL) ×1 IMPLANT
NDL HYPO 25X1 1.5 SAFETY (NEEDLE) ×1 IMPLANT
NEEDLE HYPO 25X1 1.5 SAFETY (NEEDLE) ×3 IMPLANT
NS IRRIG 1000ML POUR BTL (IV SOLUTION) ×3 IMPLANT
PACK BASIN MINOR ARMC (MISCELLANEOUS) ×3 IMPLANT
SUPPORETR ATHLETIC LG (MISCELLANEOUS) ×1 IMPLANT
SUPPORTER ATHLETIC LG (MISCELLANEOUS) ×3
SUT CHROMIC 3 0 SH 27 (SUTURE) ×5 IMPLANT
SUT ETHILON 3-0 FS-10 30 BLK (SUTURE) ×3
SUT MNCRL 3-0 UNDYED SH (SUTURE) ×1 IMPLANT
SUT MONOCRYL 3-0 UNDYED (SUTURE)
SUT SILK 0 CT 1 30 (SUTURE) ×1 IMPLANT
SUT VIC AB 3-0 SH 27 (SUTURE) ×3
SUT VIC AB 3-0 SH 27X BRD (SUTURE) ×2 IMPLANT
SUTURE EHLN 3-0 FS-10 30 BLK (SUTURE) IMPLANT
SYR 10ML LL (SYRINGE) ×3 IMPLANT

## 2019-07-27 NOTE — Op Note (Addendum)
Preoperative diagnosis:  1. Left spermatocele  Postoperative diagnosis:  1. Left spermatocele  Procedure: 1. Left spermatocelectomy  Surgeon: Abbie Sons, MD  Anesthesia: General  Complications: None  Intraoperative findings: Large left spermatocele.  Testis normal in appearance.    EBL: Minimal  Specimens: Spermatocele  Indication: Scott Potts is a 74 y.o. patient with large left spermatocele who has requested excision.  After reviewing the management options for treatment, he elected to proceed with the above surgical procedure(s). We have discussed the potential benefits and risks of the procedure, side effects of the proposed treatment, the likelihood of the patient achieving the goals of the procedure, and any potential problems that might occur during the procedure or recuperation. Informed consent has been obtained.  Description of procedure:  The patient was taken to the operating room and general anesthesia was induced.  The patient was placed in the supine position, prepped and draped in the usual sterile fashion. A preoperative time-out was performed.   A transverse incision was made in the midportion of the left hemiscrotum.  Dartos fascia was incised with cautery to expose the spermatocele which was then delivered into the operative field along with the testis.  The tunica vaginalis was incised and a small amount of hydrocele fluid was suctioned.  The tunica vaginalis was opened further with cautery.  The testis was normal in appearance and to palpation.  Layers of the tunica vaginalis overlying the spermatocele were incised with cautery.  Using a combination of blunt and sharp dissection the spermatocele was dissected down to a rather broad base which was lateral to the body of the epididymis.  The spermatocele was opened and excised at its base.  The tunica vaginalis at the site of the spermatocele were then approximated to the tunica vaginalis on the lateral aspect of  the epididymis with interrupted 3-0 chromic sutures.  The operative site was examined and hemostasis was noted to be adequate.  The testis was placed back into the left hemiscrotum in its anatomic position.  Due to the large spermatocele it was elected to place a 1/4 inch Penrose drain through the dependent portion of the left hemiscrotum through a separate stab incision.  The drain was secured with 3-0 nylon suture.  The incision was anesthetized with 0.25% plain Sensorcaine.  The dartos was closed with a running suture of 3-0 chromic and the skin was closed with a running horizontal mattress suture of 3-0 chromic.  A scrotal turban dressing, fluffs and scrotal support were applied.  The patient was transported to the PACU in stable condition after anesthetic reversal.   Abbie Sons, M.D.

## 2019-07-27 NOTE — Telephone Encounter (Signed)
APP MADE ° °

## 2019-07-27 NOTE — Discharge Instructions (Signed)
        AMBULATORY SURGERY  DISCHARGE INSTRUCTIONS   1) The drugs that you were given will stay in your system until tomorrow so for the next 24 hours you should not:  A) Drive an automobile B) Make any legal decisions C) Drink any alcoholic beverage   2) You may resume regular meals tomorrow.  Today it is better to start with liquids and gradually work up to solid foods.  You may eat anything you prefer, but it is better to start with liquids, then soup and crackers, and gradually work up to solid foods.   3) Please notify your doctor immediately if you have any unusual bleeding, trouble breathing, redness and pain at the surgery site, drainage, fever, or pain not relieved by medication.    4) Additional Instructions:        Please contact your physician with any problems or Same Day Surgery at [phone], Monday through Friday 6 am to 4 pm, or [place] at [facility] number at [phone].Discharge instructions following scrotal surgery  Call your doctor for:  Fever is greater than 100.5  Severe nausea or vomiting  Increasing pain not controlled by pain medication  Increasing redness or drainage from incisions  The number for questions or concerns is [phone]  Activity level: No lifting greater than 10 pounds (about equal to gallon of milk) for the next 1 week or until cleared to do so at follow-up appointment.  Otherwise activity as tolerated by comfort level.    Diet: May resume your regular diet as tolerated  Driving: No driving while still taking opiate pain medications (weight at least 6-8 hours after last dose).  No driving if you still sore from surgery and cannot safely apply the brake as it may limit your ability to react quickly if necessary.   Shower/bath: May shower after drain is removed.  No tub bath, hot tub or swimming for 10 days.  Wound care: You have a scrotal drain in place which will be removed on 1/21.  You may cover  wounds with sterile gauze as needed to prevent incisions rubbing on close follow-up in any seepage.  Where tight fitting underpants for at least 2 weeks.  He should apply cold compresses (ice or sac of frozen peas/corn) to your scrotum for at least 48 hours to reduce the swelling.  You should expect that his scrotum will swell up initially and then get smaller over the next 2-4 weeks.  Follow-up appointments: Follow-up appointment will be scheduled for drain removal and 48 hours and with Dr. Bernardo Heater in 4 weeks for a postop check.

## 2019-07-27 NOTE — Telephone Encounter (Signed)
-----   Message from Abbie Sons, MD sent at 07/27/2019  9:02 AM EST ----- Regarding: Follow-up Please schedule follow-up visit with Scott Potts or Larene Beach this Thursday for drain removal and postop follow-up with me in approximately 1 month.  Thanks

## 2019-07-27 NOTE — Interval H&P Note (Signed)
History and Physical Interval Note:  07/27/2019 7:25 AM  Scott Potts  has presented today for surgery, with the diagnosis of left spermatocele.  The various methods of treatment have been discussed with the patient and family. After consideration of risks, benefits and other options for treatment, the patient has consented to  Procedure(s): SPERMATOCELECTOMY (Left) as a surgical intervention.  The patient's history has been reviewed, patient examined, no change in status, stable for surgery.  I have reviewed the patient's chart and labs.  Questions were answered to the patient's satisfaction.     [place]

## 2019-07-27 NOTE — Anesthesia Procedure Notes (Signed)
Procedure Name: LMA Insertion Date/Time: 07/27/2019 7:41 AM Performed by: Gentry Fitz, CRNA Pre-anesthesia Checklist: Patient identified, Emergency Drugs available, Suction available and Patient being monitored Patient Re-evaluated:Patient Re-evaluated prior to induction Oxygen Delivery Method: Circle system utilized Preoxygenation: Pre-oxygenation with 100% oxygen Induction Type: IV induction Ventilation: Mask ventilation without difficulty LMA: LMA inserted LMA Size: 4.0 Number of attempts: 1 Tube secured with: Tape Dental Injury: Teeth and Oropharynx as per pre-operative assessment

## 2019-07-27 NOTE — Transfer of Care (Signed)
Immediate Anesthesia Transfer of Care Note  Patient: Scott Potts  Procedure(s) Performed: SPERMATOCELECTOMY (Left )  Patient Location: PACU  Anesthesia Type:General  Level of Consciousness: alert , oriented, drowsy and patient cooperative  Airway & Oxygen Therapy: Patient Spontanous Breathing and Patient connected to face mask oxygen  Post-op Assessment: Report given to RN and Post -op Vital signs reviewed and stable  Post vital signs: Reviewed and stable  Last Vitals:  Vitals Value Taken Time  BP 133/84 07/27/19 0848  Temp 36.5 C 07/27/19 0847  Pulse 69 07/27/19 0851  Resp 25 07/27/19 0851  SpO2 100 % 07/27/19 0851  Vitals shown include unvalidated device data.  Last Pain:  Vitals:   07/27/19 0626  TempSrc: Tympanic  PainSc: 0-No pain         Complications: No apparent anesthesia complications

## 2019-07-27 NOTE — Anesthesia Preprocedure Evaluation (Signed)
Anesthesia Evaluation  Patient identified by MRN, date of birth, ID band Patient awake    Reviewed: Allergy & Precautions, H&P , NPO status , Patient's Chart, lab work & pertinent test results, reviewed documented beta blocker date and time   Airway Mallampati: II  TM Distance: >3 FB Neck ROM: full    Dental  (+) Teeth Intact   Pulmonary neg pulmonary ROS, former smoker,    Pulmonary exam normal        Cardiovascular Exercise Tolerance: Good negative cardio ROS Normal cardiovascular exam Rate:Normal     Neuro/Psych Seizures -, Well Controlled,  negative psych ROS   GI/Hepatic negative GI ROS, Neg liver ROS,   Endo/Other  negative endocrine ROS  Renal/GU Renal disease  negative genitourinary   Musculoskeletal   Abdominal   Peds  Hematology negative hematology ROS (+)   Anesthesia Other Findings   Reproductive/Obstetrics negative OB ROS                             Anesthesia Physical Anesthesia Plan  ASA: II  Anesthesia Plan: General LMA   Post-op Pain Management:    Induction:   PONV Risk Score and Plan: 3  Airway Management Planned:   Additional Equipment:   Intra-op Plan:   Post-operative Plan:   Informed Consent: I have reviewed the patients History and Physical, chart, labs and discussed the procedure including the risks, benefits and alternatives for the proposed anesthesia with the patient or authorized representative who has indicated his/her understanding and acceptance.       Plan Discussed with: CRNA  Anesthesia Plan Comments:         Anesthesia Quick Evaluation

## 2019-07-28 LAB — SURGICAL PATHOLOGY

## 2019-07-28 NOTE — Progress Notes (Addendum)
Subjective:   Scott Potts is a 74 y.o. male who presents for Medicare Annual (Subsequent) preventive examination.    This visit is being conducted through telemedicine due to the COVID-19 pandemic. This patient has given me verbal consent via doximity to conduct this visit, patient states they are participating from their home address. Some vital signs may be absent or patient reported.    Patient identification: identified by name, DOB, and current address  Review of Systems:  N/A  Cardiac Risk Factors include: advanced age (>70men, >45 women);male gender     Objective:     Vitals: There were no vitals taken for this visit.  There is no height or weight on file to calculate BMI. Unable to obtain vitals due to visit being conducted via telephonically.   Advanced Directives 07/29/2019 07/27/2019 07/21/2019 06/25/2018 06/23/2017  Does Patient Have a Medical Advance Directive? Yes Yes Yes Yes Yes  Type of Paramedic of [place];Living will Healthcare Power of [place] will Living will  Does patient want to make changes to medical advance directive? - No - Patient declined No - Patient declined - -  Copy of [place] in Chart? No - copy requested No - copy requested No - copy requested - -    Tobacco Social History   Tobacco Use  Smoking Status Former Smoker  Smokeless Tobacco Never Used  Tobacco Comment   quit 2009- 1-2 a day     Counseling given: Not Answered Comment: quit 2009- 1-2 a day   Clinical Intake:  Pre-visit preparation completed: Yes  Pain : No/denies pain Pain Score: 0-No pain     Nutritional Risks: None Diabetes: No  How often do you need to have someone help you when you read instructions, pamphlets, or other written materials from your doctor or pharmacy?: 1 - Never  Interpreter Needed?: No  Information entered by :: [facility], LPN  Past Medical History:  Diagnosis  Date   History of kidney stones    History of nephrolithiasis    History of vein stripping    Mitral valve regurgitation    Seizure ([place])    6 months ago while sleeping   Thrombophlebitis of lower extremities    left   Past Surgical History:  Procedure Laterality Date   CARDIAC CATHETERIZATION  01/05/08   COLONOSCOPY  2005   endocardiogram  2009   heart valve repair  2009   SPERMATOCELECTOMY Left 07/27/2019   Procedure: SPERMATOCELECTOMY;  Surgeon: Abbie Sons, MD;  Location: [facility] ORS;  Service: Urology;  Laterality: Left;   VARICOSE VEIN SURGERY     vein strip left leg   No family history on file. Social History   Socioeconomic History   Marital status: Married    Spouse name: Scott Potts   Number of children: 2   Years of education: Not on file   Highest education level: Bachelor's degree (e.g., BA, AB, BS)  Occupational History   Occupation: Retired    Fish farm manager: [employer]  Tobacco Use   Smoking status: Former Smoker   Smokeless tobacco: Never Used   Tobacco comment: quit 2009- 1-2 a day  Substance and Sexual Activity   Alcohol use: Yes    Comment: rare / maybe 2 beers a month   Drug use: No   Sexual activity: Yes    Birth control/protection: None  Other Topics Concern   Not on file  Social History Narrative   Pt lives  alone. Moderate amount of caffeine daily. Regular exercise.   Social Determinants of Health   Financial Resource Strain: Low Risk    Difficulty of Paying Living Expenses: Not hard at all  Food Insecurity: No Food Insecurity   Worried About Charity fundraiser in the Last Year: Never true   [place] in the Last Year: Never true  Transportation Needs: No Transportation Needs   Lack of Transportation (Medical): No   Lack of Transportation (Non-Medical): No  Physical Activity: Insufficiently Active   Days of Exercise per Week: 7 days   Minutes of Exercise per Session: 20 min  Stress: No Stress Concern Present   Feeling of Stress : Not at  all  Social Connections: Slightly Isolated   Frequency of Communication with Friends and Family: More than three times a week   Frequency of Social Gatherings with Friends and Family: More than three times a week   Attends Religious Services: Never   Marine scientist or Organizations: Yes   Attends Archivist Meetings: More than 4 times per year   Marital Status: Married    Outpatient Encounter Medications as of 07/29/2019  Medication Sig   levETIRAcetam (KEPPRA) 500 MG tablet Take 500 mg by mouth 2 (two) times daily.    Multiple Vitamin (MULTIVITAMIN WITH MINERALS) TABS tablet Take 1 tablet by mouth daily.   Omega-3 Fatty Acids (RA FISH OIL) 1000 MG CAPS Take 1,000 mg by mouth 2 (two) times daily.    oxymetazoline (AFRIN) 0.05 % nasal spray Place 1 spray into both nostrils 2 (two) times daily as needed for congestion.   sulfamethoxazole-trimethoprim (BACTRIM DS) 800-160 MG tablet Take 1 tablet by mouth 2 (two) times daily for 3 days.   HYDROcodone-acetaminophen (NORCO/VICODIN) 5-325 MG tablet Take 1 tablet by mouth every 4 (four) hours as needed for moderate pain. (Patient not taking: Reported on 07/29/2019)   triamcinolone (NASACORT ALLERGY 24HR) 55 MCG/ACT AERO nasal inhaler Place 1-2 sprays into the nose daily as needed (nasal congestion.).   No facility-administered encounter medications on file as of 07/29/2019.    Activities of Daily Living In your present state of health, do you have any difficulty performing the following activities: 07/29/2019 07/21/2019  Hearing? N -  Vision? N -  Difficulty concentrating or making decisions? N -  Walking or climbing stairs? N N  Dressing or bathing? N -  Doing errands, shopping? N N  Preparing Food and eating ? N -  Using the Toilet? N -  In the past six months, have you accidently leaked urine? N -  Do you have problems with loss of bowel control? N -  Managing your Medications? N -  Managing your Finances? N -    Housekeeping or managing your Housekeeping? N -  Some recent data might be hidden    Patient Care Team: Chrismon, Vickki Muff, PA as PCP - General (Family Medicine) Vladimir Crofts, MD as Consulting Physician (Neurology) Abbie Sons, MD (Urology)    Assessment:   This is a routine wellness examination for [name].  Exercise Activities and Dietary recommendations Current Exercise Habits: Home exercise routine, Type of exercise: stretching;strength training/weights;walking, Time (Minutes): 20, Frequency (Times/Week): 7, Weekly Exercise (Minutes/Week): 140, Intensity: Moderate, Exercise limited by: None identified  Goals      DIET - INCREASE WATER INTAKE     Recommend increasing water intake to 4-6 glasses of water a day.         Fall  Risk: Fall Risk  07/29/2019 06/25/2018 06/23/2017 06/17/2016 06/15/2015  Falls in the past year? 0 0 No No No  Number falls in past yr: 0 - - - -  Injury with Fall? 0 - - - -    FALL RISK PREVENTION PERTAINING TO THE HOME:  Any stairs in or around the home? Yes  If so, are there any without handrails? No   Home free of loose throw rugs in walkways, pet beds, electrical cords, etc? Yes  Adequate lighting in your home to reduce risk of falls? Yes   ASSISTIVE DEVICES UTILIZED TO PREVENT FALLS:  Life alert? No  Use of a cane, walker or w/c? No  Grab bars in the bathroom? No  Shower chair or bench in shower? No  Elevated toilet seat or a handicapped toilet? No    TIMED UP AND GO:  Was the test performed? No .    Depression Screen PHQ 2/9 Scores 07/29/2019 07/29/2019 06/25/2018 06/25/2018  PHQ - 2 Score 0 0 0 0  PHQ- 9 Score - - 0 -     Cognitive Function: Declined today.         Immunization History  Administered Date(s) Administered   Influenza, High Dose Seasonal PF 03/31/2014, 06/19/2015, 06/17/2016, 06/23/2017, 06/25/2018   Pneumococcal Conjugate-13 12/13/2013   Pneumococcal Polysaccharide-23 12/09/2012   Tdap 11/05/2005,  11/25/2008    Qualifies for Shingles Vaccine? Yes . Due for Shingrix. Pt has been advised to call insurance company to determine out of pocket expense. Advised may also receive vaccine at local pharmacy or Health Dept. Verbalized acceptance and understanding.  Tdap: Although this vaccine is not a covered service during a Wellness Exam, does the patient still wish to receive this vaccine today?  No . Advised may receive this vaccine at local pharmacy or Health Dept. Aware to provide a copy of the vaccination record if obtained from local pharmacy or Health Dept. Verbalized acceptance and understanding.  Flu Vaccine: Due for Flu vaccine. Does the patient want to receive this vaccine today?  No . Advised may receive this vaccine at local pharmacy or Health Dept. Aware to provide a copy of the vaccination record if obtained from local pharmacy or Health Dept. Verbalized acceptance and understanding.  Pneumococcal Vaccine: Completed series  Screening Tests Health Maintenance  Topic Date Due   INFLUENZA VACCINE  02/06/2019   TETANUS/TDAP  07/28/2020 (Originally 11/26/2018)   COLONOSCOPY  02/10/2024   Hepatitis C Screening  Completed   PNA vac Low Risk Adult  Completed    Cancer Screenings:  Colorectal Screening: Completed 02/09/14. Repeat every 10 years.   Lung Cancer Screening: (Low Dose CT Chest recommended if Age 80-80 years, 30 pack-year currently smoking OR have quit w/in 15years.) does qualify however declines.   Additional Screening:  Hepatitis C Screening: Up to date  Vision Screening: Recommended annual ophthalmology exams for early detection of glaucoma and other disorders of the eye.  Dental Screening: Recommended annual dental exams for proper oral hygiene  Community Resource Referral:  CRR required this visit?  No       Plan:  I have personally reviewed and addressed the Medicare Annual Wellness questionnaire and have noted the following in the patient's chart:   A. Medical and social history B. Use of alcohol, tobacco or illicit drugs  C. Current medications and supplements D. Functional ability and status E.  Nutritional status F.  Physical activity G. Advance directives H. List of other physicians I.  Hospitalizations, surgeries, and  ER visits in previous 12 months J.  [place] such as hearing and vision if needed, cognitive and depression L. Referrals and appointments   In addition, I have reviewed and discussed with patient certain preventive protocols, quality metrics, and best practice recommendations. A written personalized care plan for preventive services as well as general preventive health recommendations were provided to patient. Nurse Health Advisor  Signed,    Merrianne Mccumbers [place], [place]  [phone] Nurse Health Advisor   Nurse Notes: Pt would like to receive his flu shot at next in office apt on 08/23/19.  Reviewed Nurse Health Advisor's note and plan. Agree with documentation and recommendations.

## 2019-07-28 NOTE — Progress Notes (Signed)
Area surrounding the Penrose drain and drain insertion site is cleansed with Betadine.    Secure suture is grasped with the forceps. The suture is gently pulled away from the skin.   Penrose drain is removed with no resistance.  Patient tolerated the removal.    Scott Potts to return 08/30/2019 with Dr. Bernardo Heater for a post operative visit.

## 2019-07-29 ENCOUNTER — Other Ambulatory Visit: Payer: Self-pay

## 2019-07-29 ENCOUNTER — Ambulatory Visit (INDEPENDENT_AMBULATORY_CARE_PROVIDER_SITE_OTHER): Payer: Medicare PPO

## 2019-07-29 ENCOUNTER — Encounter: Payer: Self-pay | Admitting: Urology

## 2019-07-29 ENCOUNTER — Ambulatory Visit: Payer: Medicare PPO | Admitting: Urology

## 2019-07-29 VITALS — BP 122/77 | HR 94 | Ht 73.0 in | Wt 173.0 lb

## 2019-07-29 DIAGNOSIS — Z0000 Encounter for general adult medical examination without abnormal findings: Secondary | ICD-10-CM

## 2019-07-29 DIAGNOSIS — N4340 Spermatocele of epididymis, unspecified: Secondary | ICD-10-CM

## 2019-07-29 NOTE — Anesthesia Postprocedure Evaluation (Signed)
Anesthesia Post Note  Patient: Scott Potts  Procedure(s) Performed: SPERMATOCELECTOMY (Left )  Patient location during evaluation: PACU Anesthesia Type: General Level of consciousness: awake and alert Pain management: pain level controlled Vital Signs Assessment: post-procedure vital signs reviewed and stable Respiratory status: spontaneous breathing, nonlabored ventilation, respiratory function stable and patient connected to nasal cannula oxygen Cardiovascular status: blood pressure returned to baseline and stable Postop Assessment: no apparent nausea or vomiting Anesthetic complications: no     Last Vitals:  Vitals:   07/27/19 0953 07/27/19 1028  BP: (!) 148/91 (!) 147/77  Pulse: 77 80  Resp: 16 16  Temp: (!) 36.3 C   SpO2: 100% 100%    Last Pain:  Vitals:   07/27/19 1028  TempSrc:   PainSc: 1                  Molli Barrows

## 2019-07-29 NOTE — Patient Instructions (Signed)
[name] , Thank you for taking time to come for your Medicare Wellness Visit. I appreciate your ongoing commitment to your health goals. Please review the following plan we discussed and let me know if I can assist you in the future.   Screening recommendations/referrals: Colonoscopy: Up to date, due 02/2024 Recommended yearly ophthalmology/optometry visit for glaucoma screening and checkup Recommended yearly dental visit for hygiene and checkup  Vaccinations: Influenza vaccine: Currently due, pt to receive at next in office apt. Pneumococcal vaccine: Completed series Tdap vaccine: Pt declines today.  Shingles vaccine: Pt declines today.     Advanced directives: Please bring a copy of your POA (Power of Attorney) and/or Living Will to your next appointment.   Conditions/risks identified: None.  Next appointment: 08/23/19 @ 9:40 AM with Vernie Murders. Declined scheduling AWV for 2022 at this time.   Preventive Care 45 Years and Older, Male Preventive care refers to lifestyle choices and visits with your health care provider that can promote health and wellness. What does preventive care include?  A yearly physical exam. This is also called an annual well check.  Dental exams once or twice a year.  Routine eye exams. Ask your health care provider how often you should have your eyes checked.  Personal lifestyle choices, including:  Daily care of your teeth and gums.  Regular physical activity.  Eating a healthy diet.  Avoiding tobacco and drug use.  Limiting alcohol use.  Practicing safe sex.  Taking low doses of aspirin every day.  Taking vitamin and mineral supplements as recommended by your health care provider. What happens during an annual well check? The services and screenings done by your health care provider during your annual well check will depend on your age, overall health, lifestyle risk factors, and family history of disease. Counseling  Your health  care provider may ask you questions about your:  Alcohol use.  Tobacco use.  Drug use.  Emotional well-being.  Home and relationship well-being.  Sexual activity.  Eating habits.  History of falls.  Memory and ability to understand (cognition).  Work and work Statistician. Screening  You may have the following tests or measurements:  Height, weight, and BMI.  Blood pressure.  Lipid and cholesterol levels. These may be checked every 5 years, or more frequently if you are over 66 years old.  Skin check.  Lung cancer screening. You may have this screening every year starting at age 29 if you have a 30-pack-year history of smoking and currently smoke or have quit within the past 15 years.  Fecal occult blood test (FOBT) of the stool. You may have this test every year starting at age 13.  Flexible sigmoidoscopy or colonoscopy. You may have a sigmoidoscopy every 5 years or a colonoscopy every 10 years starting at age 19.  Prostate cancer screening. Recommendations will vary depending on your family history and other risks.  Hepatitis C blood test.  Hepatitis B blood test.  Sexually transmitted disease (STD) testing.  Diabetes screening. This is done by checking your blood sugar (glucose) after you have not eaten for a while (fasting). You may have this done every 1-3 years.  Abdominal aortic aneurysm (AAA) screening. You may need this if you are a current or former smoker.  Osteoporosis. You may be screened starting at age 39 if you are at high risk. Talk with your health care provider about your test results, treatment options, and if necessary, the need for more tests. Vaccines  Your health  care provider may recommend certain vaccines, such as:  Influenza vaccine. This is recommended every year.  Tetanus, diphtheria, and acellular pertussis (Tdap, Td) vaccine. You may need a Td booster every 10 years.  Zoster vaccine. You may need this after age  81.  Pneumococcal 13-valent conjugate (PCV13) vaccine. One dose is recommended after age 1.  Pneumococcal polysaccharide (PPSV23) vaccine. One dose is recommended after age 45. Talk to your health care provider about which screenings and vaccines you need and how often you need them. This information is not intended to replace advice given to you by your health care provider. Make sure you discuss any questions you have with your health care provider. Document Released: 07/21/2015 Document Revised: 03/13/2016 Document Reviewed: 04/25/2015 Elsevier Interactive Patient Education  2017 [place] Prevention in the Home Falls can cause injuries. They can happen to people of all ages. There are many things you can do to make your home safe and to help prevent falls. What can I do on the outside of my home?  Regularly fix the edges of walkways and driveways and fix any cracks.  Remove anything that might make you trip as you walk through a door, such as a raised step or threshold.  Trim any bushes or trees on the path to your home.  Use bright outdoor lighting.  Clear any walking paths of anything that might make someone trip, such as rocks or tools.  Regularly check to see if handrails are loose or broken. Make sure that both sides of any steps have handrails.  Any raised decks and porches should have guardrails on the edges.  Have any leaves, snow, or ice cleared regularly.  Use sand or salt on walking paths during winter.  Clean up any spills in your garage right away. This includes oil or grease spills. What can I do in the bathroom?  Use night lights.  Install grab bars by the toilet and in the tub and shower. Do not use towel bars as grab bars.  Use non-skid mats or decals in the tub or shower.  If you need to sit down in the shower, use a plastic, non-slip stool.  Keep the floor dry. Clean up any water that spills on the floor as soon as it happens.  Remove  soap buildup in the tub or shower regularly.  Attach bath mats securely with double-sided non-slip rug tape.  Do not have throw rugs and other things on the floor that can make you trip. What can I do in the bedroom?  Use night lights.  Make sure that you have a light by your bed that is easy to reach.  Do not use any sheets or blankets that are too big for your bed. They should not hang down onto the floor.  Have a firm chair that has side arms. You can use this for support while you get dressed.  Do not have throw rugs and other things on the floor that can make you trip. What can I do in the kitchen?  Clean up any spills right away.  Avoid walking on wet floors.  Keep items that you use a lot in easy-to-reach places.  If you need to reach something above you, use a strong step stool that has a grab bar.  Keep electrical cords out of the way.  Do not use floor polish or wax that makes floors slippery. If you must use wax, use non-skid floor wax.  Do not have  throw rugs and other things on the floor that can make you trip. What can I do with my stairs?  Do not leave any items on the stairs.  Make sure that there are handrails on both sides of the stairs and use them. Fix handrails that are broken or loose. Make sure that handrails are as long as the stairways.  Check any carpeting to make sure that it is firmly attached to the stairs. Fix any carpet that is loose or worn.  Avoid having throw rugs at the top or bottom of the stairs. If you do have throw rugs, attach them to the floor with carpet tape.  Make sure that you have a light switch at the top of the stairs and the bottom of the stairs. If you do not have them, ask someone to add them for you. What else can I do to help prevent falls?  Wear shoes that:  Do not have high heels.  Have rubber bottoms.  Are comfortable and fit you well.  Are closed at the toe. Do not wear sandals.  If you use a  stepladder:  Make sure that it is fully opened. Do not climb a closed stepladder.  Make sure that both sides of the stepladder are locked into place.  Ask someone to hold it for you, if possible.  Clearly mark and make sure that you can see:  Any grab bars or handrails.  First and last steps.  Where the edge of each step is.  Use tools that help you move around (mobility aids) if they are needed. These include:  Canes.  Walkers.  Scooters.  Crutches.  Turn on the lights when you go into a dark area. Replace any light bulbs as soon as they burn out.  Set up your furniture so you have a clear path. Avoid moving your furniture around.  If any of your floors are uneven, fix them.  If there are any pets around you, be aware of where they are.  Review your medicines with your doctor. Some medicines can make you feel dizzy. This can increase your chance of falling. Ask your doctor what other things that you can do to help prevent falls. This information is not intended to replace advice given to you by your health care provider. Make sure you discuss any questions you have with your health care provider. Document Released: 04/20/2009 Document Revised: 11/30/2015 Document Reviewed: 07/29/2014 Elsevier Interactive Patient Education  2017 [employer].

## 2019-07-30 DIAGNOSIS — Z79899 Other long term (current) drug therapy: Secondary | ICD-10-CM | POA: Diagnosis not present

## 2019-07-30 DIAGNOSIS — G40909 Epilepsy, unspecified, not intractable, without status epilepticus: Secondary | ICD-10-CM | POA: Diagnosis not present

## 2019-08-02 ENCOUNTER — Encounter: Payer: Medicare PPO | Admitting: Family Medicine

## 2019-08-19 NOTE — Progress Notes (Signed)
Patient: Scott Potts, Male    DOB: Feb 17, 1946, 74 y.o.   MRN: [id number] Visit Date: 08/23/2019  Today's Provider: Vernie Murders, PA   Chief Complaint  Patient presents with  . Annual Exam   Subjective:  Scott Potts is a 74 y.o. male who presents today for health maintenance and complete physical. He feels well. He reports exercising daily. He reports he is sleeping well.  Focus Metric and Health Maintenance: UTD except for Flu vaccines Screenings done with nurse health advisor  Review of Systems  Constitutional: Negative.   HENT: Positive for tinnitus.   Eyes: Negative.   Respiratory: Negative.   Cardiovascular: Negative.   Gastrointestinal: Negative.   Endocrine: Negative.   Genitourinary: Negative.   Musculoskeletal: Negative.   Skin: Negative.   Allergic/Immunologic: Negative.   Neurological: Negative.   Hematological: Negative.   Psychiatric/Behavioral: Negative.     Social History   Socioeconomic History  . Marital status: Married    Spouse name: Rodena Piety  . Number of children: 2  . Years of education: Not on file  . Highest education level: Bachelor's degree (e.g., BA, AB, BS)  Occupational History  . Occupation: Retired    Fish farm manager: [employer]  Tobacco Use  . Smoking status: Former Research scientist (life sciences)  . Smokeless tobacco: Never Used  . Tobacco comment: quit 2009- 1-2 a day  Substance and Sexual Activity  . Alcohol use: Yes    Comment: rare / maybe 2 beers a month  . Drug use: No  . Sexual activity: Yes    Birth control/protection: None  Other Topics Concern  . Not on file  Social History Narrative   Pt lives alone. Moderate amount of caffeine daily. Regular exercise.   Social Determinants of Health   Financial Resource Strain: Low Risk   . Difficulty of Paying Living Expenses: Not hard at all  Food Insecurity: No Food Insecurity  . Worried About Charity fundraiser in the Last Year: Never true  . Ran Out of Food in the Last Year: Never true  Transportation  Needs: No Transportation Needs  . Lack of Transportation (Medical): No  . Lack of Transportation (Non-Medical): No  Physical Activity: Insufficiently Active  . Days of Exercise per Week: 7 days  . Minutes of Exercise per Session: 20 min  Stress: No Stress Concern Present  . Feeling of Stress : Not at all  Social Connections: Slightly Isolated  . Frequency of Communication with Friends and Family: More than three times a week  . Frequency of Social Gatherings with Friends and Family: More than three times a week  . Attends Religious Services: Never  . Active Member of Clubs or Organizations: Yes  . Attends Archivist Meetings: More than 4 times per year  . Marital Status: Married  Human resources officer Violence: Not At Risk  . Fear of Current or Ex-Partner: No  . Emotionally Abused: No  . Physically Abused: No  . Sexually Abused: No    Patient Active Problem List   Diagnosis Date Noted  . Peyronie's disease 08/18/2018  . Blood in the urine 06/15/2015  . Seizure ([place]) 06/15/2015  . Family history of malignant neoplasm of prostate 03/27/2015  . Asymptomatic PVCs 10/16/2011  . Mitral valve disorder 05/08/2009  . ED (erectile dysfunction) of organic origin 03/03/2009  . Disorder of mitral valve 11/23/2007  . Calculus of kidney 07/08/2000    Past Surgical History:  Procedure Laterality Date  . CARDIAC CATHETERIZATION  01/05/08  . COLONOSCOPY  2005  . endocardiogram  2009  . heart valve repair  2009  . SPERMATOCELECTOMY Left 07/27/2019   Procedure: SPERMATOCELECTOMY;  Surgeon: Abbie Sons, MD;  Location: [facility] ORS;  Service: Urology;  Laterality: Left;  Marland Kitchen VARICOSE VEIN SURGERY     vein strip left leg   His family history is not on file.    Allergies  Allergen Reactions  . Penicillins Other (See Comments)    Unknown reaction, childhood reaction Did it involve swelling of the face/tongue/throat, SOB, or low BP? Unknown Did it involve sudden or severe rash/hives,  skin peeling, or any reaction on the inside of your mouth or nose? Unknown Did you need to seek medical attention at a hospital or doctor's office? Unknown When did it last happen?childhood reaction. If all above answers are "NO", may proceed with cephalosporin use.     Outpatient Encounter Medications as of 08/23/2019  Medication Sig Note  . levETIRAcetam (KEPPRA) 500 MG tablet Take 500 mg by mouth 2 (two) times daily.    . Multiple Vitamin (MULTIVITAMIN WITH MINERALS) TABS tablet Take 1 tablet by mouth daily.   . Omega-3 Fatty Acids (RA FISH OIL) 1000 MG CAPS Take 1,000 mg by mouth 2 (two) times daily.  07/20/2019: On hold due to upcoming procedure.  Marland Kitchen oxymetazoline (AFRIN) 0.05 % nasal spray Place 1 spray into both nostrils 2 (two) times daily as needed for congestion.   Marland Kitchen HYDROcodone-acetaminophen (NORCO/VICODIN) 5-325 MG tablet Take 1 tablet by mouth every 4 (four) hours as needed for moderate pain. (Patient not taking: Reported on 07/29/2019)   . triamcinolone (NASACORT ALLERGY 24HR) 55 MCG/ACT AERO nasal inhaler Place 1-2 sprays into the nose daily as needed (nasal congestion.).    No facility-administered encounter medications on file as of 08/23/2019.    Patient Care Team: Kearstin Learn, Vickki Muff, PA as PCP - General (Family Medicine) Vladimir Crofts, MD as Consulting Physician (Neurology) Abbie Sons, MD (Urology)      Objective:   Vitals:  Vitals:   08/23/19 0929  BP: 110/72  Pulse: 68  Temp: (!) 97.1 F (36.2 C)  TempSrc: Skin  SpO2: 99%  Weight: 180 lb (81.6 kg)  Height: 6\' 1"  (1.854 m)    Physical Exam Constitutional:      Appearance: He is well-developed.  HENT:     Head: Normocephalic and atraumatic.     Right Ear: External ear normal.     Left Ear: External ear normal.     Nose: Nose normal.  Eyes:     General:        Right eye: No discharge.     Conjunctiva/sclera: Conjunctivae normal.     Pupils: Pupils are equal, round, and reactive to light.   Neck:     Thyroid: No thyromegaly.     Trachea: No tracheal deviation.  Cardiovascular:     Rate and Rhythm: Normal rate and regular rhythm.     Heart sounds: Normal heart sounds. No murmur.  Pulmonary:     Effort: Pulmonary effort is normal. No respiratory distress.     Breath sounds: Normal breath sounds. No wheezing or rales.  Chest:     Chest wall: No tenderness.  Abdominal:     General: There is no distension.     Palpations: Abdomen is soft. There is no mass.     Tenderness: There is no abdominal tenderness. There is no guarding or rebound.  Genitourinary:    Comments: Deferred to urologist (Dr. Bernardo Heater). Musculoskeletal:  General: No tenderness. Normal range of motion.     Cervical back: Normal range of motion and neck supple.  Lymphadenopathy:     Cervical: No cervical adenopathy.  Skin:    General: Skin is warm and dry.     Findings: No erythema or rash.  Neurological:     Mental Status: He is alert and oriented to person, place, and time.     Cranial Nerves: No cranial nerve deficit.     Motor: No abnormal muscle tone.     Coordination: Coordination normal.     Deep Tendon Reflexes: Reflexes are normal and symmetric. Reflexes normal.  Psychiatric:        Behavior: Behavior normal.        Thought Content: Thought content normal.        Judgment: Judgment normal.      Depression Screen PHQ 2/9 Scores 07/29/2019 07/29/2019 06/25/2018 06/25/2018  PHQ - 2 Score 0 0 0 0  PHQ- 9 Score - - 0 -      Assessment & Plan:     Routine Health Maintenance and Physical Exam  Exercise Activities and Dietary recommendations Goals    . DIET - INCREASE WATER INTAKE     Recommend increasing water intake to 4-6 glasses of water a day.        Immunization History  Administered Date(s) Administered  . Influenza, High Dose Seasonal PF 03/31/2014, 06/19/2015, 06/17/2016, 06/23/2017, 06/25/2018  . Pneumococcal Conjugate-13 12/13/2013  . Pneumococcal  Polysaccharide-23 12/09/2012  . Tdap 11/05/2005, 11/25/2008    Health Maintenance  Topic Date Due  . INFLUENZA VACCINE  02/06/2019  . TETANUS/TDAP  07/28/2020 (Originally 11/26/2018)  . COLONOSCOPY  02/10/2024  . Hepatitis C Screening  Completed  . PNA vac Low Risk Adult  Completed     Discussed health benefits of physical activity, and encouraged him to engage in regular exercise appropriate for his age and condition.   1. Low density lipoprotein (LDL) cholesterol level greater than or equal to 100 mg/dl LDL on 07-06-18 was 123. Will check CBC, CMP, TSH and Lipid Panel to see if statin needed. Should work on low fat diet and exercise level. - CBC with Differential/Platelet - Comprehensive metabolic panel - Lipid Panel With LDL/HDL Ratio - TSH  2. Tinnitus of left ear Unchanged ringing in ears. No significant hearing loss or pain. Protect ears from loud noises.  - CBC with Differential/Platelet - Comprehensive metabolic panel - TSH  3. Family history of malignant neoplasm of prostate No significant nocturia or decrease in urinary stream Will check labs and encourage to follow up with urologist. - Comprehensive metabolic panel - PSA  4. Seizure ([facility]) Tolerating Keppra 500 mg BID without side effects. Last seizure was 10 years ago. Recheck routine labs. - CBC with Differential/Platelet - Comprehensive metabolic panel - TSH  5. Need for immunization against influenza - Flu Vaccine QUAD 6+ mos PF IM (Fluarix Quad PF)  6. Encounter for Medicare annual wellness exam General health stable. Given anticipatory counseling. Recheck routine labs.

## 2019-08-23 ENCOUNTER — Other Ambulatory Visit: Payer: Self-pay

## 2019-08-23 ENCOUNTER — Ambulatory Visit (INDEPENDENT_AMBULATORY_CARE_PROVIDER_SITE_OTHER): Payer: Medicare PPO | Admitting: Family Medicine

## 2019-08-23 VITALS — BP 110/72 | HR 68 | Temp 97.1°F | Ht 73.0 in | Wt 180.0 lb

## 2019-08-23 DIAGNOSIS — Z23 Encounter for immunization: Secondary | ICD-10-CM

## 2019-08-23 DIAGNOSIS — R569 Unspecified convulsions: Secondary | ICD-10-CM

## 2019-08-23 DIAGNOSIS — H9312 Tinnitus, left ear: Secondary | ICD-10-CM | POA: Diagnosis not present

## 2019-08-23 DIAGNOSIS — Z789 Other specified health status: Secondary | ICD-10-CM | POA: Diagnosis not present

## 2019-08-23 DIAGNOSIS — Z0000 Encounter for general adult medical examination without abnormal findings: Secondary | ICD-10-CM | POA: Diagnosis not present

## 2019-08-23 DIAGNOSIS — Z8042 Family history of malignant neoplasm of prostate: Secondary | ICD-10-CM | POA: Diagnosis not present

## 2019-08-24 ENCOUNTER — Telehealth: Payer: Self-pay

## 2019-08-24 LAB — COMPREHENSIVE METABOLIC PANEL
ALT: 20 IU/L (ref 0–44)
AST: 23 IU/L (ref 0–40)
Albumin/Globulin Ratio: 2 (ref 1.2–2.2)
Albumin: 4.3 g/dL (ref 3.7–4.7)
Alkaline Phosphatase: 60 IU/L (ref 39–117)
BUN/Creatinine Ratio: 16 (ref 10–24)
BUN: 16 mg/dL (ref 8–27)
Bilirubin Total: 0.7 mg/dL (ref 0.0–1.2)
CO2: 26 mmol/L (ref 20–29)
Calcium: 9.6 mg/dL (ref 8.6–10.2)
Chloride: 103 mmol/L (ref 96–106)
Creatinine, Ser: 0.99 mg/dL (ref 0.76–1.27)
GFR calc Af Amer: 88 mL/min/{1.73_m2} (ref >59)
GFR calc non Af Amer: 76 mL/min/{1.73_m2} (ref >59)
Globulin, Total: 2.2 g/dL (ref 1.5–4.5)
Glucose: 91 mg/dL (ref 65–99)
Potassium: 4.8 mmol/L (ref 3.5–5.2)
Sodium: 141 mmol/L (ref 134–144)
Total Protein: 6.5 g/dL (ref 6.0–8.5)

## 2019-08-24 LAB — LIPID PANEL WITH LDL/HDL RATIO
Cholesterol, Total: 189 mg/dL (ref 100–199)
HDL: 57 mg/dL (ref >39)
LDL Chol Calc (NIH): 118 mg/dL — ABNORMAL HIGH (ref 0–99)
LDL/HDL Ratio: 2.1 ratio (ref 0.0–3.6)
Triglycerides: 77 mg/dL (ref 0–149)
VLDL Cholesterol Cal: 14 mg/dL (ref 5–40)

## 2019-08-24 LAB — CBC WITH DIFFERENTIAL/PLATELET
Basophils Absolute: 0.1 10*3/uL (ref 0.0–0.2)
Basos: 1 %
EOS (ABSOLUTE): 0.3 10*3/uL (ref 0.0–0.4)
Eos: 6 %
Hematocrit: 39.6 % (ref 37.5–51.0)
Hemoglobin: 13.3 g/dL (ref 13.0–17.7)
Immature Grans (Abs): 0 10*3/uL (ref 0.0–0.1)
Immature Granulocytes: 0 %
Lymphocytes Absolute: 1.8 10*3/uL (ref 0.7–3.1)
Lymphs: 31 %
MCH: 29.6 pg (ref 26.6–33.0)
MCHC: 33.6 g/dL (ref 31.5–35.7)
MCV: 88 fL (ref 79–97)
Monocytes Absolute: 0.6 10*3/uL (ref 0.1–0.9)
Monocytes: 10 %
Neutrophils Absolute: 3 10*3/uL (ref 1.4–7.0)
Neutrophils: 52 %
Platelets: 264 10*3/uL (ref 150–450)
RBC: 4.5 x10E6/uL (ref 4.14–5.80)
RDW: 12.7 % (ref 11.6–15.4)
WBC: 5.7 10*3/uL (ref 3.4–10.8)

## 2019-08-24 LAB — TSH: TSH: 2.69 u[IU]/mL (ref 0.450–4.500)

## 2019-08-24 LAB — PSA: Prostate Specific Ag, Serum: 2.9 ng/mL (ref 0.0–4.0)

## 2019-08-24 NOTE — Telephone Encounter (Signed)
Patient was advised of results.   FYI-he is going to wait on the medicine and work on his diet and exercise

## 2019-08-24 NOTE — Telephone Encounter (Signed)
-----   Message from Margo Common, [place] sent at 08/24/2019  9:08 AM EST ----- All blood tests normal except LDL still above goal of 70 due to past heart valve repair. Usually recommend a statin (such as Pravastatin 20 mg qd #90 & 3 refills) and check levels in 6 months to assess progress to prevent further cardiovascular disease.

## 2019-08-27 ENCOUNTER — Encounter: Payer: Self-pay | Admitting: Family Medicine

## 2019-08-30 ENCOUNTER — Other Ambulatory Visit: Payer: Self-pay

## 2019-08-30 ENCOUNTER — Ambulatory Visit (INDEPENDENT_AMBULATORY_CARE_PROVIDER_SITE_OTHER): Payer: Medicare PPO | Admitting: Urology

## 2019-08-30 ENCOUNTER — Encounter: Payer: Self-pay | Admitting: Urology

## 2019-08-30 VITALS — BP 111/72 | HR 81 | Ht 73.0 in | Wt 175.0 lb

## 2019-08-30 DIAGNOSIS — Z09 Encounter for follow-up examination after completed treatment for conditions other than malignant neoplasm: Secondary | ICD-10-CM

## 2019-08-30 NOTE — Progress Notes (Signed)
08/30/2019 1:05 PM   Scott Potts 04/09/1946 [id number]  Referring provider: Margo Common, [place],  [place] [address]  Chief Complaint  Patient presents with  . Follow-up    HPI: 74 year old male presents for postop follow-up.  He underwent left spermatocelectomy on 07/27/2019.  He states he did quite well and had no postoperative problems.  He did not require narcotic pain medication and has no complaints today.  Pathology: Consistent with a spermatocele   PMH: Past Medical History:  Diagnosis Date  . History of kidney stones   . History of nephrolithiasis   . History of vein stripping   . Mitral valve regurgitation   . Seizure ([place])    6 months ago while sleeping  . Thrombophlebitis of lower extremities    left    Surgical History: Past Surgical History:  Procedure Laterality Date  . CARDIAC CATHETERIZATION  01/05/08  . COLONOSCOPY  2005  . endocardiogram  2009  . heart valve repair  2009  . SPERMATOCELECTOMY Left 07/27/2019   Procedure: SPERMATOCELECTOMY;  Surgeon: Abbie Sons, MD;  Location: [facility] ORS;  Service: Urology;  Laterality: Left;  Marland Kitchen VARICOSE VEIN SURGERY     vein strip left leg    Home Medications:  Allergies as of 08/30/2019      Reactions   Penicillins Other (See Comments)   Unknown reaction, childhood reaction Did it involve swelling of the face/tongue/throat, SOB, or low BP? Unknown Did it involve sudden or severe rash/hives, skin peeling, or any reaction on the inside of your mouth or nose? Unknown Did you need to seek medical attention at a hospital or doctor's office? Unknown When did it last happen?childhood reaction. If all above answers are "NO", may proceed with cephalosporin use.      Medication List       Accurate as of August 30, 2019  1:05 PM. If you have any questions, ask your nurse or doctor.        levETIRAcetam 500 MG tablet Commonly known as: KEPPRA Take 500 mg by mouth 2 (two) times  daily.   multivitamin with minerals Tabs tablet Take 1 tablet by mouth daily.   oxymetazoline 0.05 % nasal spray Commonly known as: AFRIN Place 1 spray into both nostrils 2 (two) times daily as needed for congestion.   RA Fish Oil 1000 MG Caps Take 1,000 mg by mouth 2 (two) times daily.       Allergies:  Allergies  Allergen Reactions  . Penicillins Other (See Comments)    Unknown reaction, childhood reaction Did it involve swelling of the face/tongue/throat, SOB, or low BP? Unknown Did it involve sudden or severe rash/hives, skin peeling, or any reaction on the inside of your mouth or nose? Unknown Did you need to seek medical attention at a hospital or doctor's office? Unknown When did it last happen?childhood reaction. If all above answers are "NO", may proceed with cephalosporin use.     Family History: History reviewed. No pertinent family history.  Social History:  reports that he has quit smoking. He has never used smokeless tobacco. He reports current alcohol use. He reports that he does not use drugs.   Physical Exam: BP 111/72   Pulse 81   Ht 6\' 1"  (1.854 m)   Wt 175 lb (79.4 kg)   BMI 23.09 kg/m   Constitutional:  Alert and oriented, No acute distress. HEENT: [place] AT, moist mucus membranes.  Trachea midline, no masses. Cardiovascular: No clubbing,  cyanosis, or edema. Respiratory: Normal respiratory effort, no increased work of breathing. GU: Incision healed, no postoperative swelling   Assessment & Plan:   Doing well status post left spermatocelectomy.  Follow-up as needed   Abbie Sons, MD  [facility] [address] [place], [place] [address] [phone]

## 2020-01-15 ENCOUNTER — Encounter: Payer: Self-pay | Admitting: Family Medicine

## 2020-07-18 ENCOUNTER — Encounter: Payer: Self-pay | Admitting: Dermatology

## 2020-07-18 ENCOUNTER — Ambulatory Visit: Payer: Medicare PPO | Admitting: Dermatology

## 2020-07-18 ENCOUNTER — Other Ambulatory Visit: Payer: Self-pay

## 2020-07-18 DIAGNOSIS — Z86007 Personal history of in-situ neoplasm of skin: Secondary | ICD-10-CM

## 2020-07-18 DIAGNOSIS — D1800 Hemangioma unspecified site: Secondary | ICD-10-CM

## 2020-07-18 DIAGNOSIS — L814 Other melanin hyperpigmentation: Secondary | ICD-10-CM

## 2020-07-18 DIAGNOSIS — L821 Other seborrheic keratosis: Secondary | ICD-10-CM | POA: Diagnosis not present

## 2020-07-18 DIAGNOSIS — L720 Epidermal cyst: Secondary | ICD-10-CM | POA: Diagnosis not present

## 2020-07-18 DIAGNOSIS — D229 Melanocytic nevi, unspecified: Secondary | ICD-10-CM | POA: Diagnosis not present

## 2020-07-18 DIAGNOSIS — D692 Other nonthrombocytopenic purpura: Secondary | ICD-10-CM | POA: Diagnosis not present

## 2020-07-18 DIAGNOSIS — L905 Scar conditions and fibrosis of skin: Secondary | ICD-10-CM

## 2020-07-18 DIAGNOSIS — L570 Actinic keratosis: Secondary | ICD-10-CM | POA: Diagnosis not present

## 2020-07-18 DIAGNOSIS — L578 Other skin changes due to chronic exposure to nonionizing radiation: Secondary | ICD-10-CM | POA: Diagnosis not present

## 2020-07-18 DIAGNOSIS — Z1283 Encounter for screening for malignant neoplasm of skin: Secondary | ICD-10-CM

## 2020-07-18 NOTE — Progress Notes (Signed)
Follow-Up Visit   Subjective  Scott Potts is a 75 y.o. male who presents for the following: Annual Exam (History of SCC of the right neck. History of Aks. No spots of concern today.).   The following portions of the chart were reviewed this encounter and updated as appropriate:       Review of Systems:  No other skin or systemic complaints except as noted in HPI or Assessment and Plan.  Objective  Well appearing patient in no apparent distress; mood and affect are within normal limits.  All skin waist up examined.  Objective  Right post neck x 1, left temporal hairline x 1 (2): Erythematous thin papules/macules with gritty scale.   Objective  Sternal notch, face: 5 mm firm white papule of sternal notch.  Firm white papules of face.  Objective  Left paraspinal upper back: Scar  Objective  Right Buccal Cheek: 4 mm violaceous macule.  Objective  Right Thigh - Posterior: Waxy dark brown papule   Assessment & Plan  AK (actinic keratosis) (2) Right post neck x 1, left temporal hairline x 1  Destruction of lesion - Right post neck x 1, left temporal hairline x 1  Destruction method: cryotherapy   Informed consent: discussed and consent obtained   Lesion destroyed using liquid nitrogen: Yes   Region frozen until ice ball extended beyond lesion: Yes   Outcome: patient tolerated procedure well with no complications   Post-procedure details: wound care instructions given    Epidermal inclusion cyst Sternal notch, face  Benign-appearing. Exam most consistent with an epidermal inclusion cyst. Discussed that a cyst is a benign growth that can grow over time and sometimes get irritated or inflamed. Recommend observation if it is not bothersome. Discussed option of surgical excision to remove it if it is growing, symptomatic, or other changes noted. Please call for new or changing lesions so they can be evaluated.     Scar Left paraspinal upper back  Scar S/P  lipoma excision - Clear today  Other nonthrombocytopenic purpura ([place]) Right Buccal Cheek  Likely traumatic. Benign appearing. Observe. (Should resolve)   Seborrheic keratosis Right Thigh - Posterior  Reassured benign age-related growth.  Recommend observation.  Discussed cryotherapy if spot(s) become irritated or inflamed.      Skin cancer screening performed today.  Actinic Damage - chronic, secondary to cumulative UV radiation exposure/sun exposure over time - diffuse scaly erythematous macules with underlying dyspigmentation - Recommend daily broad spectrum sunscreen SPF 30+ to sun-exposed areas, reapply every 2 hours as needed.  - Call for new or changing lesions.  Seborrheic Keratoses - Stuck-on, waxy, tan-brown papules and plaques  - Discussed benign etiology and prognosis. - Observe - Call for any changes  Melanocytic Nevi - Tan-brown and/or pink-flesh-colored symmetric macules and papules - Benign appearing on exam today - Observation - Call clinic for new or changing moles - Recommend daily use of broad spectrum spf 30+ sunscreen to sun-exposed areas.   Lentigines - Scattered tan macules - Discussed due to sun exposure - Benign, observe - Recommend daily broad spectrum sunscreen SPF 30+ to sun-exposed areas, reapply every 2 hours as needed. - Call for any changes  Hemangiomas - Red papules - Discussed benign nature - Observe - Call for any changes  History of Squamous Cell Carcinoma in Situ of the Skin - No evidence of recurrence today - Recommend regular full body skin exams - Recommend daily broad spectrum sunscreen SPF 30+ to sun-exposed areas, reapply every  2 hours as needed.  - Call if any new or changing lesions are noted between office visits   Return in about 1 year (around 07/18/2021).  I, Ashok Cordia, CMA, am acting as scribe for Brendolyn Patty, MD .  Documentation: I have reviewed the above documentation for accuracy and completeness, and  I agree with the above.  Brendolyn Patty MD

## 2020-07-18 NOTE — Patient Instructions (Addendum)
Wound Care Instructions  1. Cleanse wound gently with soap and water once a day then pat dry with clean gauze. Apply a thing coat of Petrolatum (petroleum jelly, "Vaseline") over the wound (unless you have an allergy to this). We recommend that you use a new, sterile tube of Vaseline. Do not pick or remove scabs. Do not remove the yellow or white "healing tissue" from the base of the wound.  2. Cover the wound with fresh, clean, nonstick gauze and secure with paper tape. You may use Band-Aids in place of gauze and tape if the would is small enough, but would recommend trimming much of the tape off as there is often too much. Sometimes Band-Aids can irritate the skin.  3. You should call the office for your biopsy report after 1 week if you have not already been contacted.  4. If you experience any problems, such as abnormal amounts of bleeding, swelling, significant bruising, significant pain, or evidence of infection, please call the office immediately.  5. FOR ADULT SURGERY PATIENTS: If you need something for pain relief you may take 1 extra strength Tylenol (acetaminophen) AND 2 Ibuprofen (200mg  each) together every 4 hours as needed for pain. (do not take these if you are allergic to them or if you have a reason you should not take them.) Typically, you may only need pain medication for 1 to 3 days.    .sunsc

## 2020-07-28 ENCOUNTER — Telehealth: Payer: Self-pay | Admitting: Family Medicine

## 2020-07-28 NOTE — Telephone Encounter (Signed)
Copied from [place] [id number]. Topic: Medicare AWV >> Jul 28, 2020 11:14 AM Cher Nakai R wrote: Reason for CRM:   Left message for patient to call back and schedule Medicare Annual Wellness Visit (AWV) in office.   If not able to come in office, please offer to do virtually or by telephone.   Last AWV  07/29/2019  Please schedule at anytime with [facility] Health Advisor.  If any questions, please contact me at [phone]

## 2020-08-17 DIAGNOSIS — G40909 Epilepsy, unspecified, not intractable, without status epilepticus: Secondary | ICD-10-CM | POA: Diagnosis not present

## 2020-08-17 DIAGNOSIS — E559 Vitamin D deficiency, unspecified: Secondary | ICD-10-CM | POA: Diagnosis not present

## 2020-08-17 DIAGNOSIS — R0981 Nasal congestion: Secondary | ICD-10-CM | POA: Diagnosis not present

## 2020-08-17 DIAGNOSIS — Z79899 Other long term (current) drug therapy: Secondary | ICD-10-CM | POA: Diagnosis not present

## 2020-08-17 DIAGNOSIS — R430 Anosmia: Secondary | ICD-10-CM | POA: Diagnosis not present

## 2020-08-17 DIAGNOSIS — E538 Deficiency of other specified B group vitamins: Secondary | ICD-10-CM | POA: Diagnosis not present

## 2020-08-24 ENCOUNTER — Encounter: Payer: Medicare PPO | Admitting: Family Medicine

## 2020-08-28 ENCOUNTER — Ambulatory Visit: Payer: Self-pay | Admitting: Urology

## 2020-09-18 ENCOUNTER — Telehealth: Payer: Self-pay | Admitting: Family Medicine

## 2020-09-18 NOTE — Telephone Encounter (Signed)
Copied from [place] [phone]. Topic: Medicare AWV >> Sep 18, 2020  3:08 PM Cher Nakai R wrote: Reason for CRM: Left message for patient to call back and schedule Medicare Annual Wellness Visit (AWV) in office.   If not able to come in office, please offer to do virtually or by telephone.   Last AWV:  07/29/2019  Please schedule at anytime with [facility] Health Advisor.  If any questions, please contact me at [phone]

## 2020-10-26 ENCOUNTER — Encounter: Payer: Medicare PPO | Admitting: Family Medicine

## 2020-12-07 ENCOUNTER — Ambulatory Visit (INDEPENDENT_AMBULATORY_CARE_PROVIDER_SITE_OTHER): Payer: Medicare PPO | Admitting: Family Medicine

## 2020-12-07 ENCOUNTER — Other Ambulatory Visit: Payer: Self-pay

## 2020-12-07 ENCOUNTER — Encounter: Payer: Self-pay | Admitting: Family Medicine

## 2020-12-07 VITALS — BP 123/85 | HR 83 | Temp 97.9°F | Ht 73.0 in | Wt 169.0 lb

## 2020-12-07 DIAGNOSIS — Z0000 Encounter for general adult medical examination without abnormal findings: Secondary | ICD-10-CM

## 2020-12-07 DIAGNOSIS — R351 Nocturia: Secondary | ICD-10-CM | POA: Diagnosis not present

## 2020-12-07 DIAGNOSIS — E785 Hyperlipidemia, unspecified: Secondary | ICD-10-CM | POA: Diagnosis not present

## 2020-12-07 DIAGNOSIS — R569 Unspecified convulsions: Secondary | ICD-10-CM | POA: Diagnosis not present

## 2020-12-07 NOTE — Patient Instructions (Signed)
Preventive Care 65 Years and Older, Male Preventive care refers to lifestyle choices and visits with your health care provider that can promote health and wellness. This includes:  A yearly physical exam. This is also called an annual wellness visit.  Regular dental and eye exams.  Immunizations.  Screening for certain conditions.  Healthy lifestyle choices, such as: ? Eating a healthy diet. ? Getting regular exercise. ? Not using drugs or products that contain nicotine and tobacco. ? Limiting alcohol use. What can I expect for my preventive care visit? Physical exam Your health care provider will check your:  Height and weight. These may be used to calculate your BMI (body mass index). BMI is a measurement that tells if you are at a healthy weight.  Heart rate and blood pressure.  Body temperature.  Skin for abnormal spots. Counseling Your health care provider may ask you questions about your:  Past medical problems.  Family's medical history.  Alcohol, tobacco, and drug use.  Emotional well-being.  Home life and relationship well-being.  Sexual activity.  Diet, exercise, and sleep habits.  History of falls.  Memory and ability to understand (cognition).  Work and work environment.  Access to firearms. What immunizations do I need? Vaccines are usually given at various ages, according to a schedule. Your health care provider will recommend vaccines for you based on your age, medical history, and lifestyle or other factors, such as travel or where you work.   What tests do I need? Blood tests  Lipid and cholesterol levels. These may be checked every 5 years, or more often depending on your overall health.  Hepatitis C test.  Hepatitis B test. Screening  Lung cancer screening. You may have this screening every year starting at age 55 if you have a 30-pack-year history of smoking and currently smoke or have quit within the past 15 years.  Colorectal  cancer screening. ? All adults should have this screening starting at age 50 and continuing until age 75. ? Your health care provider may recommend screening at age 45 if you are at increased risk. ? You will have tests every 1-10 years, depending on your results and the type of screening test.  Prostate cancer screening. Recommendations will vary depending on your family history and other risks.  Genital exam to check for testicular cancer or hernias.  Diabetes screening. ? This is done by checking your blood sugar (glucose) after you have not eaten for a while (fasting). ? You may have this done every 1-3 years.  Abdominal aortic aneurysm (AAA) screening. You may need this if you are a current or former smoker.  STD (sexually transmitted disease) testing, if you are at risk. Follow these instructions at home: Eating and drinking  Eat a diet that includes fresh fruits and vegetables, whole grains, lean protein, and low-fat dairy products. Limit your intake of foods with high amounts of sugar, saturated fats, and salt.  Take vitamin and mineral supplements as recommended by your health care provider.  Do not drink alcohol if your health care provider tells you not to drink.  If you drink alcohol: ? Limit how much you have to 0-2 drinks a day. ? Be aware of how much alcohol is in your drink. In the [place] one drink equals one 12 oz bottle of beer (355 mL), one 5 oz glass of wine (148 mL), or one 1 oz glass of hard liquor (44 mL).   Lifestyle  Take daily care of your teeth   and gums. Brush your teeth every morning and night with fluoride toothpaste. Floss one time each day.  Stay active. Exercise for at least 30 minutes 5 or more days each week.  Do not use any products that contain nicotine or tobacco, such as cigarettes, e-cigarettes, and chewing tobacco. If you need help quitting, ask your health care provider.  Do not use drugs.  If you are sexually active, practice safe sex.  Use a condom or other form of protection to prevent STIs (sexually transmitted infections).  Talk with your health care provider about taking a low-dose aspirin or statin.  Find healthy ways to cope with stress, such as: ? Meditation, yoga, or listening to music. ? Journaling. ? Talking to a trusted person. ? Spending time with friends and family. Safety  Always wear your seat belt while driving or riding in a vehicle.  Do not drive: ? If you have been drinking alcohol. Do not ride with someone who has been drinking. ? When you are tired or distracted. ? While texting.  Wear a helmet and other protective equipment during sports activities.  If you have firearms in your house, make sure you follow all gun safety procedures. What's next?  Visit your health care provider once a year for an annual wellness visit.  Ask your health care provider how often you should have your eyes and teeth checked.  Stay up to date on all vaccines. This information is not intended to replace advice given to you by your health care provider. Make sure you discuss any questions you have with your health care provider. Document Revised: 03/23/2019 Document Reviewed: 06/18/2018 Elsevier Patient Education  2021 Elsevier Inc.  

## 2020-12-07 NOTE — Progress Notes (Signed)
Complete physical exam   Patient: Scott Potts   DOB: 1946/01/29   75 y.o. Male  MRN: [id number] Visit Date: 12/07/2020  Today's healthcare provider: Vernie Murders, PA-C   Chief Complaint  Patient presents with  . Annual Exam   Subjective    Scott Potts is a 75 y.o. male who presents today for a complete physical exam.  He reports consuming a general diet. Exercises regularly He generally feels well. He reports sleeping well. He does not have additional problems to discuss today.  HPI   Pt states he has lost his sense of smell a few years ago.    Past Medical History:  Diagnosis Date  . History of kidney stones   . History of nephrolithiasis   . History of vein stripping   . Mitral valve regurgitation   . Seizure ([place])    6 months ago while sleeping  . Squamous cell carcinoma of skin 06/02/2018   right neck  . Thrombophlebitis of lower extremities    left   Past Surgical History:  Procedure Laterality Date  . CARDIAC CATHETERIZATION  01/05/08  . COLONOSCOPY  2005  . endocardiogram  2009  . heart valve repair  2009  . SPERMATOCELECTOMY Left 07/27/2019   Procedure: SPERMATOCELECTOMY;  Surgeon: Abbie Sons, MD;  Location: [facility] ORS;  Service: Urology;  Laterality: Left;  Marland Kitchen VARICOSE VEIN SURGERY     vein strip left leg   Social History   Socioeconomic History  . Marital status: Married    Spouse name: Rodena Piety  . Number of children: 2  . Years of education: Not on file  . Highest education level: Bachelor's degree (e.g., BA, AB, BS)  Occupational History  . Occupation: Retired    Fish farm manager: [employer]  Tobacco Use  . Smoking status: Former Research scientist (life sciences)  . Smokeless tobacco: Never Used  . Tobacco comment: quit 2009- 1-2 a day  Vaping Use  . Vaping Use: Never used  Substance and Sexual Activity  . Alcohol use: Yes    Comment: rare / maybe 2 beers a month  . Drug use: No  . Sexual activity: Yes    Birth control/protection: None  Other Topics Concern  . Not  on file  Social History Narrative   Pt lives alone. Moderate amount of caffeine daily. Regular exercise.   Social Determinants of Health   Financial Resource Strain: Not on file  Food Insecurity: Not on file  Transportation Needs: Not on file  Physical Activity: Not on file  Stress: Not on file  Social Connections: Not on file  Intimate Partner Violence: Not on file   Family Status  Relation Name Status  . Mother  Alive  . Father  Deceased  . Sister  Alive  . Brother  Alive  . Brother  Alive  . Neg Hx  (Not Specified)   Family History  Problem Relation Age of Onset  . Prostate cancer Brother   . Prostate cancer Brother   . Colon cancer Neg Hx    Allergies  Allergen Reactions  . Penicillins Other (See Comments)    Unknown reaction, childhood reaction Did it involve swelling of the face/tongue/throat, SOB, or low BP? Unknown Did it involve sudden or severe rash/hives, skin peeling, or any reaction on the inside of your mouth or nose? Unknown Did you need to seek medical attention at a hospital or doctor's office? Unknown When did it last happen?childhood reaction. If all above answers are "NO", may  proceed with cephalosporin use.     Patient Care Team: Emry Tobin, Vickki Muff, PA-C as PCP - General (Family Medicine) Vladimir Crofts, MD as Consulting Physician (Neurology) Abbie Sons, MD (Urology)   Medications: Outpatient Medications Prior to Visit  Medication Sig  . levETIRAcetam (KEPPRA) 500 MG tablet Take 500 mg by mouth 2 (two) times daily.   . Multiple Vitamin (MULTIVITAMIN WITH MINERALS) TABS tablet Take 1 tablet by mouth daily.  . Omega-3 Fatty Acids (RA FISH OIL) 1000 MG CAPS Take 1,000 mg by mouth 2 (two) times daily.   Marland Kitchen oxymetazoline (AFRIN) 0.05 % nasal spray Place 1 spray into both nostrils 2 (two) times daily as needed for congestion.   No facility-administered medications prior to visit.    Review of Systems  Constitutional: Negative.   HENT:  Negative.   Eyes: Negative.   Respiratory: Negative.   Cardiovascular: Negative.   Gastrointestinal: Negative.   Endocrine: Negative.   Genitourinary: Negative.   Musculoskeletal: Negative.   Skin: Negative.   Allergic/Immunologic: Negative.   Neurological: Negative.   Hematological: Negative.   Psychiatric/Behavioral: Negative.       Objective    BP 123/85 (BP Location: Right Arm, Patient Position: Sitting, Cuff Size: Normal)   Pulse 83   Temp 97.9 F (36.6 C) (Oral)   Ht 6\' 1"  (1.854 m)   Wt 169 lb (76.7 kg)   BMI 22.30 kg/m   Wt Readings from Last 3 Encounters:  12/07/20 169 lb (76.7 kg)  08/30/19 175 lb (79.4 kg)  08/23/19 180 lb (81.6 kg)    Physical Exam Constitutional:      Appearance: Normal appearance. He is normal weight.  HENT:     Head: Normocephalic and atraumatic.     Right Ear: Tympanic membrane, ear canal and external ear normal.     Left Ear: Tympanic membrane, ear canal and external ear normal.     Nose: Nose normal.     Mouth/Throat:     Mouth: Mucous membranes are moist.     Pharynx: Oropharynx is clear.  Eyes:     Extraocular Movements: Extraocular movements intact.     Conjunctiva/sclera: Conjunctivae normal.     Pupils: Pupils are equal, round, and reactive to light.  Cardiovascular:     Rate and Rhythm: Normal rate and regular rhythm.     Pulses: Normal pulses.     Heart sounds: Normal heart sounds.  Pulmonary:     Effort: Pulmonary effort is normal.     Breath sounds: Normal breath sounds.  Abdominal:     General: Abdomen is flat. Bowel sounds are normal.     Palpations: Abdomen is soft.  Musculoskeletal:        General: Normal range of motion.     Cervical back: Normal range of motion and neck supple.  Skin:    General: Skin is warm and dry.  Neurological:     General: No focal deficit present.     Mental Status: He is alert and oriented to person, place, and time. Mental status is at baseline.  Psychiatric:        Mood and  Affect: Mood normal.        Behavior: Behavior normal.        Thought Content: Thought content normal.        Judgment: Judgment normal.     Last depression screening scores PHQ 2/9 Scores 07/29/2019 07/29/2019 06/25/2018  PHQ - 2 Score 0 0 0  PHQ- 9 Score - -  0   Last fall risk screening Fall Risk  07/29/2019  Falls in the past year? 0  Number falls in past yr: 0  Injury with Fall? 0   Last Audit-C alcohol use screening Alcohol Use Disorder Test (AUDIT) 07/29/2019  1. How often do you have a drink containing alcohol? 1  2. How many drinks containing alcohol do you have on a typical day when you are drinking? 0  3. How often do you have six or more drinks on one occasion? 0  AUDIT-C Score 1   A score of 3 or more in women, and 4 or more in men indicates increased risk for alcohol abuse, EXCEPT if all of the points are from question 1   No results found for any visits on 12/07/20.  Assessment & Plan    Routine Health Maintenance and Physical Exam  Exercise Activities and Dietary recommendations Goals    . DIET - INCREASE WATER INTAKE     Recommend increasing water intake to 4-6 glasses of water a day.        Immunization History  Administered Date(s) Administered  . Influenza, High Dose Seasonal PF 03/31/2014, 06/19/2015, 06/17/2016, 06/23/2017, 06/25/2018  . Influenza,inj,Quad PF,6+ Mos 08/23/2019  . Influenza-Unspecified 04/12/2020  . PFIZER(Purple Top)SARS-COV-2 Vaccination 10/19/2019, 11/09/2019, 06/14/2020  . Pneumococcal Conjugate-13 12/13/2013  . Pneumococcal Polysaccharide-23 12/09/2012  . Tdap 11/05/2005, 11/25/2008    Health Maintenance  Topic Date Due  . Zoster Vaccines- Shingrix (1 of 2) Never done  . TETANUS/TDAP  11/26/2018  . COVID-19 Vaccine (4 - Booster for Pfizer series) 09/12/2020  . INFLUENZA VACCINE  02/05/2021  . COLONOSCOPY (Pts 45-71yrs Insurance coverage will need to be confirmed)  02/10/2024  . Hepatitis C Screening  Completed  . PNA vac  Low Risk Adult  Completed  . HPV VACCINES  Aged Out    Discussed health benefits of physical activity, and encouraged him to engage in regular exercise appropriate for his age and condition.  1. Encounter for Medicare annual wellness exam Good general health. BMI 22. Immunizations up to date. Counseled regarding health maintenance. - CBC with Differential/Platelet - Comprehensive metabolic panel - Lipid panel - TSH - PSA - Hemoglobin A1c  2. Seizure ([facility]) Followed by Dr. Manuella Ghazi (neurologist). Still taking Keppra daily. Recheck labs. - CBC with Differential/Platelet - Comprehensive metabolic panel - TSH - Hemoglobin A1c  3. Hyperlipidemia LDL goal <70 Has changed lifestyle and diet. Will recheck labs. - CBC with Differential/Platelet - Comprehensive metabolic panel - Lipid panel - TSH  4. Nocturia 1-2 times a night. No significant decrease in stream. Family history positive for cancer in 2 brothers. - CBC with Differential/Platelet - Comprehensive metabolic panel - PSA - Hemoglobin A1c   No follow-ups on file.     I, Glenys Snader, PA-C, have reviewed all documentation for this visit. The documentation on 12/07/20 for the exam, diagnosis, procedures, and orders are all accurate and complete.    Vernie Murders, PA-C  [employer] [phone] (phone) [phone] (fax)  [place]

## 2020-12-08 ENCOUNTER — Telehealth: Payer: Self-pay

## 2020-12-08 DIAGNOSIS — E7849 Other hyperlipidemia: Secondary | ICD-10-CM

## 2020-12-08 LAB — LIPID PANEL
Chol/HDL Ratio: 3 ratio (ref 0.0–5.0)
Cholesterol, Total: 172 mg/dL (ref 100–199)
HDL: 58 mg/dL (ref >39)
LDL Chol Calc (NIH): 103 mg/dL — ABNORMAL HIGH (ref 0–99)
Triglycerides: 54 mg/dL (ref 0–149)
VLDL Cholesterol Cal: 11 mg/dL (ref 5–40)

## 2020-12-08 LAB — CBC WITH DIFFERENTIAL/PLATELET
Basophils Absolute: 0.1 10*3/uL (ref 0.0–0.2)
Basos: 2 %
EOS (ABSOLUTE): 0.2 10*3/uL (ref 0.0–0.4)
Eos: 4 %
Hematocrit: 39.6 % (ref 37.5–51.0)
Hemoglobin: 12.9 g/dL — ABNORMAL LOW (ref 13.0–17.7)
Immature Grans (Abs): 0 10*3/uL (ref 0.0–0.1)
Immature Granulocytes: 0 %
Lymphocytes Absolute: 1.6 10*3/uL (ref 0.7–3.1)
Lymphs: 32 %
MCH: 29.1 pg (ref 26.6–33.0)
MCHC: 32.6 g/dL (ref 31.5–35.7)
MCV: 89 fL (ref 79–97)
Monocytes Absolute: 0.5 10*3/uL (ref 0.1–0.9)
Monocytes: 10 %
Neutrophils Absolute: 2.6 10*3/uL (ref 1.4–7.0)
Neutrophils: 52 %
Platelets: 250 10*3/uL (ref 150–450)
RBC: 4.43 x10E6/uL (ref 4.14–5.80)
RDW: 12.6 % (ref 11.6–15.4)
WBC: 4.8 10*3/uL (ref 3.4–10.8)

## 2020-12-08 LAB — COMPREHENSIVE METABOLIC PANEL
ALT: 19 IU/L (ref 0–44)
AST: 25 IU/L (ref 0–40)
Albumin/Globulin Ratio: 2.1 (ref 1.2–2.2)
Albumin: 4.7 g/dL (ref 3.7–4.7)
Alkaline Phosphatase: 58 IU/L (ref 44–121)
BUN/Creatinine Ratio: 24 (ref 10–24)
BUN: 23 mg/dL (ref 8–27)
Bilirubin Total: 0.9 mg/dL (ref 0.0–1.2)
CO2: 24 mmol/L (ref 20–29)
Calcium: 9.5 mg/dL (ref 8.6–10.2)
Chloride: 104 mmol/L (ref 96–106)
Creatinine, Ser: 0.96 mg/dL (ref 0.76–1.27)
Globulin, Total: 2.2 g/dL (ref 1.5–4.5)
Glucose: 93 mg/dL (ref 65–99)
Potassium: 4.5 mmol/L (ref 3.5–5.2)
Sodium: 142 mmol/L (ref 134–144)
Total Protein: 6.9 g/dL (ref 6.0–8.5)
eGFR: 83 mL/min/{1.73_m2} (ref >59)

## 2020-12-08 LAB — PSA: Prostate Specific Ag, Serum: 2.7 ng/mL (ref 0.0–4.0)

## 2020-12-08 LAB — TSH: TSH: 2.73 u[IU]/mL (ref 0.450–4.500)

## 2020-12-08 LAB — HEMOGLOBIN A1C
Est. average glucose Bld gHb Est-mCnc: 114 mg/dL
Hgb A1c MFr Bld: 5.6 % (ref 4.8–5.6)

## 2020-12-08 NOTE — Telephone Encounter (Signed)
-----   Message from Margo Common, PA-C sent at 12/08/2020 11:08 AM EDT ----- Labs essentially normal. Borderline hemoglobin probably would be better if he took a multivitamin with iron daily. LDL cholesterol improving. Continue diet and exercise and may hold off on the Pravastatin. Recheck levels in 4-6 months.

## 2020-12-08 NOTE — Telephone Encounter (Signed)
Patient advised. Orders placed to recheck cholesterol level.

## 2021-01-27 ENCOUNTER — Encounter: Payer: Self-pay | Admitting: Family Medicine

## 2021-01-30 DIAGNOSIS — M720 Palmar fascial fibromatosis [Dupuytren]: Secondary | ICD-10-CM | POA: Diagnosis not present

## 2021-02-20 DIAGNOSIS — M720 Palmar fascial fibromatosis [Dupuytren]: Secondary | ICD-10-CM | POA: Diagnosis not present

## 2021-02-21 ENCOUNTER — Encounter: Payer: Self-pay | Admitting: Family Medicine

## 2021-04-19 ENCOUNTER — Other Ambulatory Visit: Payer: Self-pay

## 2021-04-19 DIAGNOSIS — E7849 Other hyperlipidemia: Secondary | ICD-10-CM

## 2021-04-20 LAB — LIPID PANEL
Chol/HDL Ratio: 3.2 ratio (ref 0.0–5.0)
Cholesterol, Total: 182 mg/dL (ref 100–199)
HDL: 57 mg/dL (ref >39)
LDL Chol Calc (NIH): 115 mg/dL — ABNORMAL HIGH (ref 0–99)
Triglycerides: 54 mg/dL (ref 0–149)
VLDL Cholesterol Cal: 10 mg/dL (ref 5–40)

## 2021-07-12 ENCOUNTER — Encounter: Payer: Self-pay | Admitting: Dermatology

## 2021-07-16 ENCOUNTER — Other Ambulatory Visit: Payer: Self-pay

## 2021-07-16 ENCOUNTER — Encounter: Payer: Self-pay | Admitting: Dermatology

## 2021-07-16 ENCOUNTER — Ambulatory Visit: Payer: Medicare PPO | Admitting: Dermatology

## 2021-07-16 DIAGNOSIS — L853 Xerosis cutis: Secondary | ICD-10-CM

## 2021-07-16 DIAGNOSIS — L570 Actinic keratosis: Secondary | ICD-10-CM | POA: Diagnosis not present

## 2021-07-16 DIAGNOSIS — L578 Other skin changes due to chronic exposure to nonionizing radiation: Secondary | ICD-10-CM

## 2021-07-16 DIAGNOSIS — D1800 Hemangioma unspecified site: Secondary | ICD-10-CM

## 2021-07-16 DIAGNOSIS — Z1283 Encounter for screening for malignant neoplasm of skin: Secondary | ICD-10-CM

## 2021-07-16 DIAGNOSIS — Z86018 Personal history of other benign neoplasm: Secondary | ICD-10-CM | POA: Diagnosis not present

## 2021-07-16 DIAGNOSIS — L820 Inflamed seborrheic keratosis: Secondary | ICD-10-CM | POA: Diagnosis not present

## 2021-07-16 DIAGNOSIS — L905 Scar conditions and fibrosis of skin: Secondary | ICD-10-CM

## 2021-07-16 DIAGNOSIS — D229 Melanocytic nevi, unspecified: Secondary | ICD-10-CM

## 2021-07-16 DIAGNOSIS — L814 Other melanin hyperpigmentation: Secondary | ICD-10-CM

## 2021-07-16 DIAGNOSIS — Z85828 Personal history of other malignant neoplasm of skin: Secondary | ICD-10-CM | POA: Diagnosis not present

## 2021-07-16 DIAGNOSIS — L821 Other seborrheic keratosis: Secondary | ICD-10-CM

## 2021-07-16 NOTE — Patient Instructions (Addendum)
Cryotherapy Aftercare  Wash gently with soap and water everyday.   Apply Vaseline and Band-Aid daily until healed.    Melanoma ABCDEs  Melanoma is the most dangerous type of skin cancer, and is the leading cause of death from skin disease.  You are more likely to develop melanoma if you: Have light-colored skin, light-colored eyes, or red or blond hair Spend a lot of time in the sun Tan regularly, either outdoors or in a tanning bed Have had blistering sunburns, especially during childhood Have a close family member who has had a melanoma Have atypical moles or large birthmarks  Early detection of melanoma is key since treatment is typically straightforward and cure rates are extremely high if we catch it early.   The first sign of melanoma is often a change in a mole or a new dark spot.  The ABCDE system is a way of remembering the signs of melanoma.  A for asymmetry:  The two halves do not match. B for border:  The edges of the growth are irregular. C for color:  A mixture of colors are present instead of an even brown color. D for diameter:  Melanomas are usually (but not always) greater than 63mm - the size of a pencil eraser. E for evolution:  The spot keeps changing in size, shape, and color.  Please check your skin once per month between visits. You can use a small mirror in front and a large mirror behind you to keep an eye on the back side or your body.   If you see any new or changing lesions before your next follow-up, please call to schedule a visit.  Please continue daily skin protection including broad spectrum sunscreen SPF 30+ to sun-exposed areas, reapplying every 2 hours as needed when you're outdoors.    If You Need Anything After Your Visit  If you have any questions or concerns for your doctor, please call our main line at [phone] and press option 4 to reach your doctor's medical assistant. If no one answers, please leave a voicemail as directed and we will  return your call as soon as possible. Messages left after 4 pm will be answered the following business day.   You may also send [place] a message via [place]. We typically respond to MyChart messages within 1-2 business days.  For prescription refills, please ask your pharmacy to contact our office. Our fax number is [phone].  If you have an urgent issue when the clinic is closed that cannot wait until the next business day, you can page your doctor at the number below.    Please note that while we do our best to be available for urgent issues outside of office hours, we are not available 24/7.   If you have an urgent issue and are unable to reach [place], you may choose to seek medical care at your doctor's office, retail clinic, urgent care center, or emergency room.  If you have a medical emergency, please immediately call 911 or go to the emergency department.  Pager Numbers  - Dr. Nehemiah Massed: [phone]  - Dr. Laurence Ferrari: [phone]  - Dr. Nicole Kindred: [phone]  In the event of inclement weather, please call our main line at [phone] for an update on the status of any delays or closures.  Dermatology Medication Tips: Please keep the boxes that topical medications come in in order to help keep track of the instructions about where and how to use these. Pharmacies typically print the medication  instructions only on the boxes and not directly on the medication tubes.   If your medication is too expensive, please contact our office at [phone] option 4 or send [place] a message through [place].   We are unable to tell what your co-pay for medications will be in advance as this is different depending on your insurance coverage. However, we may be able to find a substitute medication at lower cost or fill out paperwork to get insurance to cover a needed medication.   If a prior authorization is required to get your medication covered by your insurance company, please allow [place] 1-2 business  days to complete this process.  Drug prices often vary depending on where the prescription is filled and some pharmacies may offer cheaper prices.  The website www.goodrx.com contains coupons for medications through different pharmacies. The prices here do not account for what the cost may be with help from insurance (it may be cheaper with your insurance), but the website can give you the price if you did not use any insurance.  - You can print the associated coupon and take it with your prescription to the pharmacy.  - You may also stop by our office during regular business hours and pick up a GoodRx coupon card.  - If you need your prescription sent electronically to a different pharmacy, notify our office through [facility] or by phone at [phone] option 4.     Si Usted Necesita Algo Despus de Su Visita  Tambin puede enviarnos un mensaje a travs de Pharmacist, community. Por lo general respondemos a los mensajes de MyChart en el transcurso de 1 a 2 das hbiles.  Para renovar recetas, por favor pida a su farmacia que se ponga en contacto con nuestra oficina. Harland Dingwall de fax es [place] [phone].  Si tiene un asunto urgente cuando la clnica est cerrada y que no puede esperar hasta el siguiente da hbil, puede llamar/localizar a su doctor(a) al nmero que aparece a continuacin.   Por favor, tenga en cuenta que aunque hacemos todo lo posible para estar disponibles para asuntos urgentes fuera del horario de [place], no estamos disponibles las 24 horas del da, los 7 das de la [place].   Si tiene un problema urgente y no puede comunicarse con nosotros, puede optar por buscar atencin mdica  en el consultorio de su doctor(a), en una clnica privada, en un centro de atencin urgente o en una sala de emergencias.  Si tiene Engineering geologist, por favor llame inmediatamente al 911 o vaya a la sala de emergencias.  Nmeros de bper  - Dr. Nehemiah Massed: [phone]  - Dra. Moye:  [phone]  - Dra. Nicole Kindred: [phone]  En caso de inclemencias del [place], por favor llame a Johnsie Kindred principal al [phone] para una actualizacin sobre el [place] de cualquier retraso o cierre.  Consejos para la medicacin en dermatologa: Por favor, guarde las cajas en las que vienen los medicamentos de uso tpico para ayudarle a seguir las instrucciones sobre dnde y cmo usarlos. Las farmacias generalmente imprimen las instrucciones del medicamento slo en las cajas y no directamente en los tubos del [place].   Si su medicamento es muy caro, por favor, pngase en contacto con Zigmund Daniel llamando al [phone] y presione la opcin 4 o envenos un mensaje a travs de Pharmacist, community.   No podemos decirle cul ser su copago por los medicamentos por adelantado ya que esto es diferente dependiendo de la cobertura de su seguro. Sin embargo,  es posible que podamos encontrar un medicamento sustituto a Electrical engineer un formulario para que el seguro cubra el medicamento que se considera necesario.   Si se requiere una autorizacin previa para que su compaa de seguros [place] su medicamento, por favor permtanos de 1 a 2 das hbiles para completar este proceso.  Los precios de los medicamentos varan con frecuencia dependiendo del Environmental consultant de dnde se surte la receta y alguna farmacias pueden ofrecer precios ms baratos.  El sitio web www.goodrx.com tiene cupones para medicamentos de Airline pilot. Los precios aqu no tienen en cuenta lo que podra costar con la ayuda del seguro (puede ser ms barato con su seguro), pero el sitio web puede darle el precio si no utiliz Research scientist (physical sciences).  - Puede imprimir el cupn correspondiente y llevarlo con su receta a la farmacia.  - Tambin puede pasar por nuestra oficina durante el horario de atencin regular y Charity fundraiser una tarjeta de cupones de GoodRx.  - Si necesita que su receta se enve electrnicamente a una farmacia diferente,  informe a nuestra oficina a travs de MyChart de [place] o por telfono llamando al [phone] y presione la opcin 4.

## 2021-07-16 NOTE — Progress Notes (Signed)
Follow-Up Visit   Subjective  Scott Potts is a 76 y.o. male who presents for the following: Annual Exam (Patient here for full body skin exam and skin cancer screening. Patient does have a hx of SCC and AK's. Patient not aware of any new or changing spots.).  He has a few spots that get irritated.   The following portions of the chart were reviewed this encounter and updated as appropriate:       Review of Systems:  No other skin or systemic complaints except as noted in HPI or Assessment and Plan.  Objective  Well appearing patient in no apparent distress; mood and affect are within normal limits.  A full examination was performed including scalp, head, eyes, ears, nose, lips, neck, chest, axillae, abdomen, back, buttocks, bilateral upper extremities, bilateral lower extremities, hands, feet, fingers, toes, fingernails, and toenails. All findings within normal limits unless otherwise noted below.  Right Spinal Upper Back x 1, right forearm x 2, left wrist x 2, left forearm x 2, right forehead x 1, left upper forehead x 3, left hand x 1 (12) Erythematous thin papules/macules with gritty scale.   Left Mid Back Depressed dyspigmented scar   spinal upper back x 1, right axilla x 2, right post shoulder x 1 (4) Erythematous stuck-on, waxy papule or plaque    Assessment & Plan  AK (actinic keratosis) (12) Right Spinal Upper Back x 1, right forearm x 2, left wrist x 2, left forearm x 2, right forehead x 1, left upper forehead x 3, left hand x 1  Destruction of lesion - Right Spinal Upper Back x 1, right forearm x 2, left wrist x 2, left forearm x 2, right forehead x 1, left upper forehead x 3, left hand x 1  Destruction method: cryotherapy   Informed consent: discussed and consent obtained   Lesion destroyed using liquid nitrogen: Yes   Region frozen until ice ball extended beyond lesion: Yes   Outcome: patient tolerated procedure well with no complications   Post-procedure  details: wound care instructions given   Additional details:  Prior to procedure, discussed risks of blister formation, small wound, skin dyspigmentation, or rare scar following cryotherapy. Recommend Vaseline ointment to treated areas while healing.   Scar Left Mid Back  Benign, Hx of lipoma removal  Inflamed seborrheic keratosis (4) spinal upper back x 1, right axilla x 2, right post shoulder x 1  Destruction of lesion - spinal upper back x 1, right axilla x 2, right post shoulder x 1 Complexity: simple   Destruction method: cryotherapy   Informed consent: discussed and consent obtained   Lesion destroyed using liquid nitrogen: Yes   Region frozen until ice ball extended beyond lesion: Yes   Outcome: patient tolerated procedure well with no complications   Post-procedure details: wound care instructions given     Lentigines - Scattered tan macules - Due to sun exposure - Benign-appearing, observe - Recommend daily broad spectrum sunscreen SPF 30+ to sun-exposed areas, reapply every 2 hours as needed. - Call for any changes  Seborrheic Keratoses - Stuck-on, waxy, tan-brown papules and/or plaques  - Benign-appearing - Discussed benign etiology and prognosis. - Observe - Call for any changes  Melanocytic Nevi - Tan-brown and/or pink-flesh-colored symmetric macules and papules left shoulder, left forearm - Benign appearing on exam today - Observation - Call clinic for new or changing moles - Recommend daily use of broad spectrum spf 30+ sunscreen to sun-exposed areas.  Hemangiomas - Red papules - Discussed benign nature - Observe - Call for any changes  Actinic Damage - Chronic condition, secondary to cumulative UV/sun exposure - diffuse scaly erythematous macules with underlying dyspigmentation - Recommend daily broad spectrum sunscreen SPF 30+ to sun-exposed areas, reapply every 2 hours as needed.  - Staying in the shade or wearing long sleeves, sun glasses  (UVA+UVB protection) and wide brim hats (4-inch brim around the entire circumference of the hat) are also recommended for sun protection.  - Call for new or changing lesions.  Skin cancer screening performed today.  History of Squamous Cell Carcinoma of the Skin - No evidence of recurrence today R neck - Recommend regular full body skin exams - Recommend daily broad spectrum sunscreen SPF 30+ to sun-exposed areas, reapply every 2 hours as needed.  - Call if any new or changing lesions are noted between office visits  Xerosis - diffuse xerotic patches - recommend gentle, hydrating skin care - gentle skin care handout given   Return in about 1 year (around 07/16/2022) for TBSE.  Graciella Belton, RMA, am acting as scribe for Brendolyn Patty, MD .  Documentation: I have reviewed the above documentation for accuracy and completeness, and I agree with the above.  Brendolyn Patty MD

## 2021-08-16 DIAGNOSIS — E538 Deficiency of other specified B group vitamins: Secondary | ICD-10-CM | POA: Diagnosis not present

## 2021-08-16 DIAGNOSIS — G40909 Epilepsy, unspecified, not intractable, without status epilepticus: Secondary | ICD-10-CM | POA: Diagnosis not present

## 2021-08-16 DIAGNOSIS — R430 Anosmia: Secondary | ICD-10-CM | POA: Diagnosis not present

## 2021-08-16 DIAGNOSIS — Z79899 Other long term (current) drug therapy: Secondary | ICD-10-CM | POA: Diagnosis not present

## 2021-08-16 DIAGNOSIS — E559 Vitamin D deficiency, unspecified: Secondary | ICD-10-CM | POA: Diagnosis not present

## 2021-08-25 ENCOUNTER — Emergency Department
Admission: EM | Admit: 2021-08-25 | Discharge: 2021-08-25 | Disposition: A | Discharge status: Home / Self Care | Payer: Medicare PPO | Attending: Emergency Medicine | Admitting: Emergency Medicine

## 2021-08-25 ENCOUNTER — Other Ambulatory Visit: Payer: Self-pay

## 2021-08-25 ENCOUNTER — Emergency Department: Payer: Medicare PPO

## 2021-08-25 DIAGNOSIS — S61411A Laceration without foreign body of right hand, initial encounter: Secondary | ICD-10-CM | POA: Insufficient documentation

## 2021-08-25 DIAGNOSIS — W268XXA Contact with other sharp object(s), not elsewhere classified, initial encounter: Secondary | ICD-10-CM | POA: Diagnosis not present

## 2021-08-25 DIAGNOSIS — Z23 Encounter for immunization: Secondary | ICD-10-CM | POA: Diagnosis not present

## 2021-08-25 DIAGNOSIS — S6991XA Unspecified injury of right wrist, hand and finger(s), initial encounter: Secondary | ICD-10-CM | POA: Diagnosis present

## 2021-08-25 DIAGNOSIS — S61214A Laceration without foreign body of right ring finger without damage to nail, initial encounter: Secondary | ICD-10-CM | POA: Diagnosis not present

## 2021-08-25 MED ORDER — TETANUS-DIPHTH-ACELL PERTUSSIS 5-2.5-18.5 LF-MCG/0.5 IM SUSY
0.5000 mL | PREFILLED_SYRINGE | Freq: Once | INTRAMUSCULAR | Status: AC
  Administered 2021-08-25: 0.5 mL via INTRAMUSCULAR
  Filled 2021-08-25: qty 0.5

## 2021-08-25 MED ORDER — DOXYCYCLINE MONOHYDRATE 100 MG PO TABS: 100.0000 mg | ORAL_TABLET | Freq: Two times a day (BID) | ORAL | 0 refills | Status: AC

## 2021-08-25 MED ORDER — DOXYCYCLINE MONOHYDRATE 100 MG PO TABS: 100.0000 mg | ORAL_TABLET | Freq: Two times a day (BID) | ORAL | 0 refills | Status: DC

## 2021-08-25 MED ORDER — LIDOCAINE HCL 1 % IJ SOLN
10.0000 mL | Freq: Once | INTRAMUSCULAR | Status: AC
  Administered 2021-08-25: 10 mL
  Filled 2021-08-25: qty 10

## 2021-08-25 NOTE — ED Triage Notes (Signed)
Pt states she was bending a pipe and it snapped and sliced his hand under his ring finger on his R hand- bleeding controlled at this time- last tetanus unknown

## 2021-08-25 NOTE — ED Provider Notes (Signed)
[facility] Provider Note  Patient Contact: 6:32 PM (approximate)   History   Laceration   HPI  Scott Potts is a 76 y.o. male presents to the emergency department with a 4 cm jagged laceration at the base of the right ring finger sustained from a metal drying rack.  No similar injuries in the past.  Patient cannot recall his last tetanus shot.      Physical Exam   Triage Vital Signs: ED Triage Vitals  Enc Vitals Group     BP 08/25/21 1630 (!) 137/93     Pulse Rate 08/25/21 1630 81     Resp 08/25/21 1630 18     Temp 08/25/21 1630 98 F (36.7 C)     Temp Source 08/25/21 1630 Oral     SpO2 08/25/21 1630 98 %     Weight 08/25/21 1628 175 lb (79.4 kg)     Height 08/25/21 1628 6\' 1"  (1.854 m)     Head Circumference --      Peak Flow --      Pain Score 08/25/21 1628 2     Pain Loc --      Pain Edu? --      Excl. in [place]? --     Most recent vital signs: Vitals:   08/25/21 1630  BP: (!) 137/93  Pulse: 81  Resp: 18  Temp: 98 F (36.7 C)  SpO2: 98%     General: Alert and in no acute distress. Eyes:  PERRL. EOMI. Head: No acute traumatic findings Cardiovascular:  Good peripheral perfusion Respiratory: Normal respiratory effort without tachypnea or retractions. Lungs CTAB. Good air entry to the bases with no decreased or absent breath sounds. Gastrointestinal: Bowel sounds 4 quadrants. Soft and nontender to palpation. No guarding or rigidity. No palpable masses. No distention. No CVA tenderness. Musculoskeletal: Full range of motion to all extremities.  Neurologic:  No gross focal neurologic deficits are appreciated.  Skin: Patient has a 4 cm linear jagged laceration at the base of the right ring finger.    ED Results / Procedures / Treatments   Labs (all labs ordered are listed, but only abnormal results are displayed) Labs Reviewed - No data to display     RADIOLOGY  I personally viewed and evaluated these images as part of my  medical decision making, as well as reviewing the written report by the radiologist.  ED Provider Interpretation: I personally reviewed x-ray of right hand and there are no acute bony abnormalities or retained foreign bodies.   PROCEDURES:  Critical Care performed: No  ..Laceration Repair  Date/Time: 08/25/2021 6:34 PM Performed by: Lannie Fields, PA-C Authorized by: Lannie Fields, PA-C   Consent:    Consent obtained:  Verbal   Risks discussed:  Infection and pain Universal protocol:    Procedure explained and questions answered to patient or proxy's satisfaction: yes     Patient identity confirmed:  Verbally with patient Anesthesia:    Anesthesia method:  Local infiltration   Local anesthetic:  Lidocaine 1% w/o epi Laceration details:    Location:  Finger   Finger location:  R ring finger   Length (cm):  4   Depth (mm):  5 Exploration:    Limited defect created (wound extended): no     Contaminated: yes   Treatment:    Area cleansed with:  Povidone-iodine   Irrigation solution:  Sterile saline   Irrigation volume:  500   Irrigation method:  Syringe   Debridement:  None   Undermining:  None Skin repair:    Repair method:  Sutures   Suture size:  4-0   Suture technique:  Simple interrupted   Number of sutures:  15 Approximation:    Approximation:  Close Repair type:    Repair type:  Intermediate Post-procedure details:    Dressing:  Non-adherent dressing   MEDICATIONS ORDERED IN ED: Medications  Tdap (BOOSTRIX) injection 0.5 mL (0.5 mLs Intramuscular Given 08/25/21 1724)  lidocaine (XYLOCAINE) 1 % (with pres) injection 10 mL (10 mLs Infiltration Given by Other 08/25/21 1818)     IMPRESSION / MDM / ASSESSMENT AND PLAN / ED COURSE  I reviewed the triage vital signs and the nursing notes.                              Assessment and plan:  Hand laceration:   Differential diagnosis includes, but is not limited to, hand laceration, hand contusion,  fracture  76 year old male presents to the emergency department with a 4 cm jagged laceration at the base of the right ring finger.  Vital signs are reassuring at triage.  On physical exam, patient was alert, active and nontoxic-appearing.  Laceration was repaired in the emergency department without complication.   There was no retained foreign body or acute fractures.  She was advised to have sutures removed in 10 days.  Patient was discharged with doxycycline.  Return precautions were given to return for new or worsening symptoms.   FINAL CLINICAL IMPRESSION(S) / ED DIAGNOSES   Final diagnoses:  Laceration of right hand without foreign body, initial encounter     Rx / DC Orders   ED Discharge Orders          Ordered    doxycycline (ADOXA) 100 MG tablet  2 times daily,   Status:  Discontinued        08/25/21 1904    doxycycline (ADOXA) 100 MG tablet  2 times daily        08/25/21 1910             Note:  This document was prepared using Dragon voice recognition software and may include unintentional dictation errors.   Vallarie Mare Pease, PA-C 08/25/21 1945    Arta Silence, MD 08/25/21 2332

## 2021-08-25 NOTE — Discharge Instructions (Signed)
Take Doxycycline twice daily for seven days. Have sutures removed in ten days.  Apply thin layer of vaseline to wound once daily. Change dressing every twenty-four hours. Return with any redness or streaking surrounding the wound site.

## 2021-08-25 NOTE — ED Notes (Signed)
Pt discharge information reviewed. Pt understands need for follow up care and when to return if symptoms worsen. All questions answered. Pt is alert and oriented with even and regular respirations. Pt is seen ambulating out of department with string steady gait.   

## 2021-09-03 ENCOUNTER — Encounter: Payer: Self-pay | Admitting: Physician Assistant

## 2021-09-03 ENCOUNTER — Ambulatory Visit (INDEPENDENT_AMBULATORY_CARE_PROVIDER_SITE_OTHER): Payer: Medicare PPO | Admitting: Physician Assistant

## 2021-09-03 ENCOUNTER — Other Ambulatory Visit: Payer: Self-pay

## 2021-09-03 VITALS — BP 121/79 | HR 80 | Ht 73.0 in | Wt 178.8 lb

## 2021-09-03 DIAGNOSIS — Z4802 Encounter for removal of sutures: Secondary | ICD-10-CM | POA: Diagnosis not present

## 2021-09-03 NOTE — Progress Notes (Signed)
Acute Office Visit  Subjective:    Patient ID: Scott Potts, male    DOB: 1945/12/04, 76 y.o.   MRN: [id number]  Cc. Suture removal   HPI Patient is in today for stitch removal from his right hand. Reports cutting his right ring finger on a metal drying rack. Denies any pain, erythema edema. Pt reports using hydrogen peroxide on laceration on a regular basis along with vaseline.   Past Medical History:  Diagnosis Date   History of kidney stones    History of nephrolithiasis    History of vein stripping    Mitral valve regurgitation    Seizure (HCC)    6 months ago while sleeping   Squamous cell carcinoma of skin 06/02/2018   right neck, superficially invasive well differentiated   Thrombophlebitis of lower extremities    left    Past Surgical History:  Procedure Laterality Date   CARDIAC CATHETERIZATION  01/05/08   COLONOSCOPY  2005   endocardiogram  2009   heart valve repair  2009   SPERMATOCELECTOMY Left 07/27/2019   Procedure: SPERMATOCELECTOMY;  Surgeon: Abbie Sons, MD;  Location: [facility] ORS;  Service: Urology;  Laterality: Left;   VARICOSE VEIN SURGERY     vein strip left leg    Family History  Problem Relation Age of Onset   Prostate cancer Brother    Prostate cancer Brother    Colon cancer Neg Hx     Social History   Socioeconomic History   Marital status: Married    Spouse name: Rodena Piety   Number of children: 2   Years of education: Not on file   Highest education level: Bachelor's degree (e.g., BA, AB, BS)  Occupational History   Occupation: Retired    Fish farm manager: [employer]  Tobacco Use   Smoking status: Former   Smokeless tobacco: Never   Tobacco comments:    quit 2009- 1-2 a day  Vaping Use   Vaping Use: Never used  Substance and Sexual Activity   Alcohol use: Yes    Comment: rare / maybe 2 beers a month   Drug use: No   Sexual activity: Yes    Birth control/protection: None  Other Topics Concern   Not on file  Social History Narrative    Pt lives alone. Moderate amount of caffeine daily. Regular exercise.   Social Determinants of Health   Financial Resource Strain: Not on file  Food Insecurity: Not on file  Transportation Needs: Not on file  Physical Activity: Not on file  Stress: Not on file  Social Connections: Not on file  Intimate Partner Violence: Not on file    Outpatient Medications Prior to Visit  Medication Sig Dispense Refill   levETIRAcetam (KEPPRA) 500 MG tablet Take 500 mg by mouth 2 (two) times daily.      Multiple Vitamin (MULTIVITAMIN WITH MINERALS) TABS tablet Take 1 tablet by mouth daily.     Omega-3 Fatty Acids (RA FISH OIL) 1000 MG CAPS Take 1,000 mg by mouth 2 (two) times daily.      oxymetazoline (AFRIN) 0.05 % nasal spray Place 1 spray into both nostrils 2 (two) times daily as needed for congestion.     No facility-administered medications prior to visit.    Allergies  Allergen Reactions   Penicillins Other (See Comments)    Unknown reaction, childhood reaction Did it involve swelling of the face/tongue/throat, SOB, or low BP? Unknown Did it involve sudden or severe rash/hives, skin peeling, or any reaction on  the inside of your mouth or nose? Unknown Did you need to seek medical attention at a hospital or doctor's office? Unknown When did it last happen?childhood reaction.   If all above answers are NO, may proceed with cephalosporin use.     Review of Systems  Constitutional:  Negative for fatigue and fever.  Respiratory:  Negative for cough and shortness of breath.   Cardiovascular:  Negative for chest pain, palpitations and leg swelling.  Neurological:  Negative for dizziness and headaches.      Objective:    Physical Exam Constitutional:      General: He is awake.     Appearance: He is well-developed. He is not ill-appearing.  HENT:     Head: Normocephalic.  Eyes:     Conjunctiva/sclera: Conjunctivae normal.  Cardiovascular:     Rate and Rhythm: Normal rate.   Pulmonary:     Effort: Pulmonary effort is normal.  Musculoskeletal:     Comments: Right ring finger palmar surface at base there is a healing jagged laceration. Pt had covered with bandaid.  Edges are not well approximated, but internally approximated. No erythema, edema, discharge.   Skin:    General: Skin is warm.  Neurological:     Mental Status: He is alert and oriented to person, place, and time.  Psychiatric:        Attention and Perception: Attention normal.        Mood and Affect: Mood normal.        Speech: Speech normal.        Behavior: Behavior normal. Behavior is cooperative.    There were no vitals taken for this visit. Wt Readings from Last 3 Encounters:  08/25/21 175 lb (79.4 kg)  12/07/20 169 lb (76.7 kg)  08/30/19 175 lb (79.4 kg)    Health Maintenance Due  Topic Date Due   Zoster Vaccines- Shingrix (1 of 2) Never done   COVID-19 Vaccine (4 - Booster for Pfizer series) 08/09/2020    There are no preventive care reminders to display for this patient.   Lab Results  Component Value Date   TSH 2.730 12/07/2020   Lab Results  Component Value Date   WBC 4.8 12/07/2020   HGB 12.9 (L) 12/07/2020   HCT 39.6 12/07/2020   MCV 89 12/07/2020   PLT 250 12/07/2020   Lab Results  Component Value Date   NA 142 12/07/2020   K 4.5 12/07/2020   CO2 24 12/07/2020   GLUCOSE 93 12/07/2020   BUN 23 12/07/2020   CREATININE 0.96 12/07/2020   BILITOT 0.9 12/07/2020   ALKPHOS 58 12/07/2020   AST 25 12/07/2020   ALT 19 12/07/2020   PROT 6.9 12/07/2020   ALBUMIN 4.7 12/07/2020   CALCIUM 9.5 12/07/2020   EGFR 83 12/07/2020   Lab Results  Component Value Date   CHOL 182 04/19/2021   Lab Results  Component Value Date   HDL 57 04/19/2021   Lab Results  Component Value Date   LDLCALC 115 (H) 04/19/2021   Lab Results  Component Value Date   TRIG 54 04/19/2021   Lab Results  Component Value Date   CHOLHDL 3.2 04/19/2021   Lab Results  Component  Value Date   HGBA1C 5.6 12/07/2020       Assessment & Plan:   Suture removal, laceration to right ring finger Edges not well approximated likely d/t hydrogen peroxide use.  Suture were removed, many in pieces. Steri strips were placed on the areas  not well approximated and the finger was wrapped in gauze and secured with tape. Hemostasis maintained Instructed to not get wet, to keep occlusive bandage on until completely closed.  Please stop using hydrogen peroxide, can clean with soap and water. Advised of infectious precautions  I, Mikey Kirschner, PA-C have reviewed all documentation for this visit. The documentation on  09/03/2021 for the exam, diagnosis, procedures, and orders are all accurate and complete.  Mikey Kirschner, PA-C [facility] [address] [place], [place], [address] Office: [phone] Fax: [phone]

## 2021-12-10 ENCOUNTER — Ambulatory Visit (INDEPENDENT_AMBULATORY_CARE_PROVIDER_SITE_OTHER): Payer: Medicare PPO | Admitting: Physician Assistant

## 2021-12-10 ENCOUNTER — Encounter: Payer: Self-pay | Admitting: Physician Assistant

## 2021-12-10 VITALS — BP 126/86 | HR 75 | Temp 97.9°F | Ht 73.0 in | Wt 178.8 lb

## 2021-12-10 DIAGNOSIS — Z0000 Encounter for general adult medical examination without abnormal findings: Secondary | ICD-10-CM

## 2021-12-10 DIAGNOSIS — G40909 Epilepsy, unspecified, not intractable, without status epilepticus: Secondary | ICD-10-CM

## 2021-12-10 DIAGNOSIS — Z125 Encounter for screening for malignant neoplasm of prostate: Secondary | ICD-10-CM

## 2021-12-10 DIAGNOSIS — E782 Mixed hyperlipidemia: Secondary | ICD-10-CM | POA: Diagnosis not present

## 2021-12-10 DIAGNOSIS — Z8042 Family history of malignant neoplasm of prostate: Secondary | ICD-10-CM

## 2021-12-10 DIAGNOSIS — J302 Other seasonal allergic rhinitis: Secondary | ICD-10-CM

## 2021-12-10 NOTE — Assessment & Plan Note (Signed)
Managed by neuro, takes keppra Last seizure in 2015

## 2021-12-10 NOTE — Progress Notes (Signed)
I,Sha'taria Tyson,acting as a Education administrator for [employer], PA-C.,have documented all relevant documentation on the behalf of Mikey Kirschner, PA-C,as directed by  Mikey Kirschner, PA-C while in the presence of Mikey Kirschner, PA-C.  Annual Wellness Visit     Patient: Scott Potts, Male    DOB: 1946-05-06, 76 y.o.   MRN: [id number] Visit Date: 12/10/2021  Today's Provider: Mikey Kirschner, PA-C   Cc. awv  Subjective    Scott Potts is a 76 y.o. male who presents today for his Annual Wellness Visit. He reports consuming a general diet.  The patient reports walking 5 miles per day and going golfing at least 5xs a week.   He generally feels well. He reports sleeping well. He does have additional problems to discuss today.   Reports more nasal congestion this season and sneezing more than he usually does.   Pt states his dermatologist requested a few labs. Brings information with him.  Medications: Outpatient Medications Prior to Visit  Medication Sig   levETIRAcetam (KEPPRA) 500 MG tablet Take 500 mg by mouth 2 (two) times daily.    Multiple Vitamin (MULTIVITAMIN WITH MINERALS) TABS tablet Take 1 tablet by mouth daily.   Omega-3 Fatty Acids (RA FISH OIL) 1000 MG CAPS Take 1,000 mg by mouth 2 (two) times daily.    No facility-administered medications prior to visit.    Allergies  Allergen Reactions   Penicillins Other (See Comments)    Unknown reaction, childhood reaction Did it involve swelling of the face/tongue/throat, SOB, or low BP? Unknown Did it involve sudden or severe rash/hives, skin peeling, or any reaction on the inside of your mouth or nose? Unknown Did you need to seek medical attention at a hospital or doctor's office? Unknown When did it last happen?childhood reaction.   If all above answers are "NO", may proceed with cephalosporin use.     Patient Care Team: Mikey Kirschner, Hershal Coria as PCP - General (Physician Assistant) Vladimir Crofts, MD as Consulting Physician  (Neurology) Abbie Sons, MD (Urology)  Review of Systems  Constitutional:  Negative for fatigue and fever.  HENT:  Positive for congestion.   Respiratory:  Negative for cough and shortness of breath.   Cardiovascular:  Negative for chest pain, palpitations and leg swelling.  Genitourinary:  Positive for frequency.  Neurological:  Negative for dizziness and headaches.      Objective    Blood pressure 126/86, pulse 75, temperature 97.9 F (36.6 C), height '6\' 1"'$  (1.854 m), weight 178 lb 12.8 oz (81.1 kg), SpO2 100 %.    Physical Exam Constitutional:      General: He is awake.     Appearance: He is well-developed.  HENT:     Head: Normocephalic.     Right Ear: Tympanic membrane, ear canal and external ear normal.     Left Ear: Tympanic membrane, ear canal and external ear normal.     Nose: Nose normal. No congestion or rhinorrhea.     Mouth/Throat:     Mouth: Mucous membranes are moist.     Pharynx: No oropharyngeal exudate or posterior oropharyngeal erythema.  Eyes:     Pupils: Pupils are equal, round, and reactive to light.  Cardiovascular:     Rate and Rhythm: Normal rate and regular rhythm.     Heart sounds: Normal heart sounds.  Pulmonary:     Effort: Pulmonary effort is normal.     Breath sounds: Normal breath sounds.  Abdominal:  General: There is no distension.     Palpations: Abdomen is soft.     Tenderness: There is no abdominal tenderness. There is no guarding.  Musculoskeletal:     Cervical back: Normal range of motion.     Right lower leg: No edema.     Left lower leg: No edema.  Lymphadenopathy:     Cervical: No cervical adenopathy.  Skin:    General: Skin is warm.  Neurological:     Mental Status: He is alert and oriented to person, place, and time.  Psychiatric:        Attention and Perception: Attention normal.        Mood and Affect: Mood normal.        Speech: Speech normal.        Behavior: Behavior normal. Behavior is cooperative.     Most recent functional status assessment:    12/10/2021    8:43 AM  In your present state of health, do you have any difficulty performing the following activities:  Hearing? 0  Vision? 0  Comment wears readers  Difficulty concentrating or making decisions? 0  Walking or climbing stairs? 0  Dressing or bathing? 0   Most recent fall risk assessment:    12/10/2021    8:42 AM  Fall Risk   Number falls in past yr: 0  Injury with Fall? 0  Risk for fall due to : No Fall Risks    Most recent depression screenings:    12/10/2021    8:42 AM 12/07/2020   10:00 AM  PHQ 2/9 Scores  PHQ - 2 Score 0 0  PHQ- 9 Score 0 0   Most recent cognitive screening:     View : No data to display.         Most recent Audit-C alcohol use screening    12/10/2021    8:42 AM  Alcohol Use Disorder Test (AUDIT)  1. How often do you have a drink containing alcohol? 1  2. How many drinks containing alcohol do you have on a typical day when you are drinking? 0  3. How often do you have six or more drinks on one occasion? 0  AUDIT-C Score 1   A score of 3 or more in women, and 4 or more in men indicates increased risk for alcohol abuse, EXCEPT if all of the points are from question 1   No results found for any visits on 12/10/21.  Assessment & Plan     Annual wellness visit done today including the all of the following: Reviewed patient's Family Medical History Reviewed and updated list of patient's medical providers Assessment of cognitive impairment was done Assessed patient's functional ability Established a written schedule for health screening [place] Completed and Reviewed  Exercise Activities and Dietary recommendations  Goals      DIET - INCREASE WATER INTAKE     Recommend increasing water intake to 4-6 glasses of water a day.          Immunization History  Administered Date(s) Administered   Influenza, High Dose Seasonal PF 03/31/2014, 06/19/2015,  06/17/2016, 06/23/2017, 06/25/2018   Influenza,inj,Quad PF,6+ Mos 08/23/2019   Influenza-Unspecified 04/12/2020   PFIZER(Purple Top)SARS-COV-2 Vaccination 10/19/2019, 11/09/2019, 06/14/2020   Pneumococcal Conjugate-13 12/13/2013   Pneumococcal Polysaccharide-23 12/09/2012   Tdap 11/05/2005, 11/25/2008, 08/25/2021    Health Maintenance  Topic Date Due   COVID-19 Vaccine (4 - Booster for Cook series) 12/26/2021 (Originally 08/09/2020)   Zoster Vaccines- Shingrix (  1 of 2) 03/12/2022 (Originally 01/13/1965)   INFLUENZA VACCINE  02/05/2022   COLONOSCOPY (Pts 45-27yr Insurance coverage will need to be confirmed)  02/10/2024   TETANUS/TDAP  08/26/2031   Pneumonia Vaccine 75 Years old  Completed   Hepatitis C Screening  Completed   HPV VACCINES  Aged Out     Discussed health benefits of physical activity, and encouraged him to engage in regular exercise appropriate for his age and condition.    Problem List Items Addressed This Visit       Respiratory   Seasonal allergic rhinitis    Recommend claritin OTC, consistent with seasonal allergies         Nervous and Auditory   [place]    Managed by neuro, takes keppra Last seizure in 2015       Relevant Orders   CBC w/Diff/Platelet   Levetiracetam level   Vitamin D (25 hydroxy)   Vitamin B12     Other   Family history of malignant neoplasm of prostate   Relevant Orders   PSA   Other Visit Diagnoses     Encounter for annual wellness exam in Medicare patient    -  Primary   Moderate mixed hyperlipidemia not requiring statin therapy       Relevant Orders   Comprehensive Metabolic Panel (CMET)   Lipid Profile   Prostate cancer screening       Relevant Orders   PSA        Return in about 1 year (around 12/11/2022) for CPE.     I, LMikey Kirschner PA-C have reviewed all documentation for this visit. The documentation on  12/10/2021  for the exam, diagnosis, procedures, and orders are all accurate and  complete.  LMikey Kirschner PA-C [id number] [place] [id number]: [phone]: [phone]

## 2021-12-10 NOTE — Assessment & Plan Note (Signed)
Recommend claritin OTC, consistent with seasonal allergies

## 2021-12-11 LAB — VITAMIN D 25 HYDROXY (VIT D DEFICIENCY, FRACTURES): Vit D, 25-Hydroxy: 50.9 ng/mL (ref 30.0–100.0)

## 2021-12-11 LAB — COMPREHENSIVE METABOLIC PANEL
ALT: 22 IU/L (ref 0–44)
AST: 30 IU/L (ref 0–40)
Albumin/Globulin Ratio: 2.1 (ref 1.2–2.2)
Albumin: 4.5 g/dL (ref 3.7–4.7)
Alkaline Phosphatase: 66 IU/L (ref 44–121)
BUN/Creatinine Ratio: 18 (ref 10–24)
BUN: 18 mg/dL (ref 8–27)
Bilirubin Total: 0.8 mg/dL (ref 0.0–1.2)
CO2: 27 mmol/L (ref 20–29)
Calcium: 9.6 mg/dL (ref 8.6–10.2)
Chloride: 102 mmol/L (ref 96–106)
Creatinine, Ser: 0.99 mg/dL (ref 0.76–1.27)
Globulin, Total: 2.1 g/dL (ref 1.5–4.5)
Glucose: 98 mg/dL (ref 70–99)
Potassium: 4.5 mmol/L (ref 3.5–5.2)
Sodium: 140 mmol/L (ref 134–144)
Total Protein: 6.6 g/dL (ref 6.0–8.5)
eGFR: 80 mL/min/{1.73_m2} (ref >59)

## 2021-12-11 LAB — CBC WITH DIFFERENTIAL/PLATELET
Basophils Absolute: 0.1 10*3/uL (ref 0.0–0.2)
Basos: 1 %
EOS (ABSOLUTE): 0.3 10*3/uL (ref 0.0–0.4)
Eos: 6 %
Hematocrit: 37.4 % — ABNORMAL LOW (ref 37.5–51.0)
Hemoglobin: 12.7 g/dL — ABNORMAL LOW (ref 13.0–17.7)
Immature Grans (Abs): 0 10*3/uL (ref 0.0–0.1)
Immature Granulocytes: 0 %
Lymphocytes Absolute: 1.7 10*3/uL (ref 0.7–3.1)
Lymphs: 34 %
MCH: 29.8 pg (ref 26.6–33.0)
MCHC: 34 g/dL (ref 31.5–35.7)
MCV: 88 fL (ref 79–97)
Monocytes Absolute: 0.5 10*3/uL (ref 0.1–0.9)
Monocytes: 11 %
Neutrophils Absolute: 2.5 10*3/uL (ref 1.4–7.0)
Neutrophils: 48 %
Platelets: 217 10*3/uL (ref 150–450)
RBC: 4.26 x10E6/uL (ref 4.14–5.80)
RDW: 12.4 % (ref 11.6–15.4)
WBC: 5.1 10*3/uL (ref 3.4–10.8)

## 2021-12-11 LAB — LIPID PANEL
Chol/HDL Ratio: 2.6 ratio (ref 0.0–5.0)
Cholesterol, Total: 143 mg/dL (ref 100–199)
HDL: 55 mg/dL (ref >39)
LDL Chol Calc (NIH): 78 mg/dL (ref 0–99)
Triglycerides: 46 mg/dL (ref 0–149)
VLDL Cholesterol Cal: 10 mg/dL (ref 5–40)

## 2021-12-11 LAB — LEVETIRACETAM LEVEL: Levetiracetam Lvl: 15.8 ug/mL (ref 10.0–40.0)

## 2021-12-11 LAB — VITAMIN B12: Vitamin B-12: 592 pg/mL (ref 232–1245)

## 2021-12-11 LAB — PSA: Prostate Specific Ag, Serum: 3.1 ng/mL (ref 0.0–4.0)

## 2021-12-13 ENCOUNTER — Encounter: Payer: Self-pay | Admitting: Physician Assistant

## 2022-01-21 ENCOUNTER — Ambulatory Visit: Payer: Medicare PPO | Admitting: Physician Assistant

## 2022-04-29 DIAGNOSIS — H2513 Age-related nuclear cataract, bilateral: Secondary | ICD-10-CM | POA: Diagnosis not present

## 2022-07-09 DIAGNOSIS — R69 Illness, unspecified: Secondary | ICD-10-CM | POA: Diagnosis not present

## 2022-07-22 ENCOUNTER — Ambulatory Visit: Payer: Medicare HMO | Admitting: Dermatology

## 2022-07-22 DIAGNOSIS — L820 Inflamed seborrheic keratosis: Secondary | ICD-10-CM | POA: Diagnosis not present

## 2022-07-22 DIAGNOSIS — D485 Neoplasm of uncertain behavior of skin: Secondary | ICD-10-CM

## 2022-07-22 DIAGNOSIS — L821 Other seborrheic keratosis: Secondary | ICD-10-CM

## 2022-07-22 DIAGNOSIS — L578 Other skin changes due to chronic exposure to nonionizing radiation: Secondary | ICD-10-CM

## 2022-07-22 DIAGNOSIS — Z85828 Personal history of other malignant neoplasm of skin: Secondary | ICD-10-CM

## 2022-07-22 DIAGNOSIS — L814 Other melanin hyperpigmentation: Secondary | ICD-10-CM | POA: Diagnosis not present

## 2022-07-22 DIAGNOSIS — C44529 Squamous cell carcinoma of skin of other part of trunk: Secondary | ICD-10-CM

## 2022-07-22 DIAGNOSIS — Z1283 Encounter for screening for malignant neoplasm of skin: Secondary | ICD-10-CM | POA: Diagnosis not present

## 2022-07-22 NOTE — Patient Instructions (Addendum)
Cryotherapy Aftercare  Wash gently with soap and water everyday.   Apply Vaseline and Band-Aid daily until healed.  Wound Care Instructions  Cleanse wound gently with soap and water once a day then pat dry with clean gauze. Apply a thin coat of Petrolatum (petroleum jelly, "Vaseline") over the wound (unless you have an allergy to this). We recommend that you use a new, sterile tube of Vaseline. Do not pick or remove scabs. Do not remove the yellow or white "healing tissue" from the base of the wound.  Cover the wound with fresh, clean, nonstick gauze and secure with paper tape. You may use Band-Aids in place of gauze and tape if the wound is small enough, but would recommend trimming much of the tape off as there is often too much. Sometimes Band-Aids can irritate the skin.  You should call the office for your biopsy report after 1 week if you have not already been contacted.  If you experience any problems, such as abnormal amounts of bleeding, swelling, significant bruising, significant pain, or evidence of infection, please call the office immediately.  FOR ADULT SURGERY PATIENTS: If you need something for pain relief you may take 1 extra strength Tylenol (acetaminophen) AND 2 Ibuprofen (200mg each) together every 4 hours as needed for pain. (do not take these if you are allergic to them or if you have a reason you should not take them.) Typically, you may only need pain medication for 1 to 3 days.      Due to recent changes in healthcare laws, you may see results of your pathology and/or laboratory studies on MyChart before the doctors have had a chance to review them. We understand that in some cases there may be results that are confusing or concerning to you. Please understand that not all results are received at the same time and often the doctors may need to interpret multiple results in order to provide you with the best plan of care or course of treatment. Therefore, we ask that you  please give us 2 business days to thoroughly review all your results before contacting the office for clarification. Should we see a critical lab result, you will be contacted sooner.   If You Need Anything After Your Visit  If you have any questions or concerns for your doctor, please call our main line at [phone] and press option 4 to reach your doctor's medical assistant. If no one answers, please leave a voicemail as directed and we will return your call as soon as possible. Messages left after 4 pm will be answered the following business day.   You may also send us a message via MyChart. We typically respond to MyChart messages within 1-2 business days.  For prescription refills, please ask your pharmacy to contact our office. Our fax number is [phone].  If you have an urgent issue when the clinic is closed that cannot wait until the next business day, you can page your doctor at the number below.    Please note that while we do our best to be available for urgent issues outside of office hours, we are not available 24/7.   If you have an urgent issue and are unable to reach us, you may choose to seek medical care at your doctor's office, retail clinic, urgent care center, or emergency room.  If you have a medical emergency, please immediately call 911 or go to the emergency department.  Pager Numbers  - Dr. Kowalski: [phone]  -   Dr. Moye: [phone]  - Dr. Stewart: [phone]  In the event of inclement weather, please call our main line at [phone] for an update on the status of any delays or closures.  Dermatology Medication Tips: Please keep the boxes that topical medications come in in order to help keep track of the instructions about where and how to use these. Pharmacies typically print the medication instructions only on the boxes and not directly on the medication tubes.   If your medication is too expensive, please contact our office at  [phone] option 4 or send us a message through MyChart.   We are unable to tell what your co-pay for medications will be in advance as this is different depending on your insurance coverage. However, we may be able to find a substitute medication at lower cost or fill out paperwork to get insurance to cover a needed medication.   If a prior authorization is required to get your medication covered by your insurance company, please allow us 1-2 business days to complete this process.  Drug prices often vary depending on where the prescription is filled and some pharmacies may offer cheaper prices.  The website www.goodrx.com contains coupons for medications through different pharmacies. The prices here do not account for what the cost may be with help from insurance (it may be cheaper with your insurance), but the website can give you the price if you did not use any insurance.  - You can print the associated coupon and take it with your prescription to the pharmacy.  - You may also stop by our office during regular business hours and pick up a GoodRx coupon card.  - If you need your prescription sent electronically to a different pharmacy, notify our office through [place] MyChart or by phone at [phone] option 4.     Si Usted Necesita Algo Despus de Su Visita  Tambin puede enviarnos un mensaje a travs de MyChart. Por lo general respondemos a los mensajes de MyChart en el transcurso de 1 a 2 das hbiles.  Para renovar recetas, por favor pida a su farmacia que se ponga en contacto con nuestra oficina. Nuestro nmero de fax es el [phone].  Si tiene un asunto urgente cuando la clnica est cerrada y que no puede esperar hasta el siguiente da hbil, puede llamar/localizar a su doctor(a) al nmero que aparece a continuacin.   Por favor, tenga en cuenta que aunque hacemos todo lo posible para estar disponibles para asuntos urgentes fuera del horario de oficina, no estamos  disponibles las 24 horas del da, los 7 das de la semana.   Si tiene un problema urgente y no puede comunicarse con nosotros, puede optar por buscar atencin mdica  en el consultorio de su doctor(a), en una clnica privada, en un centro de atencin urgente o en una sala de emergencias.  Si tiene una emergencia mdica, por favor llame inmediatamente al 911 o vaya a la sala de emergencias.  Nmeros de bper  - Dr. Kowalski: [phone]  - Dra. Moye: [phone]  - Dra. Stewart: [phone]  En caso de inclemencias del tiempo, por favor llame a nuestra lnea principal al [phone] para una actualizacin sobre el estado de cualquier retraso o cierre.  Consejos para la medicacin en dermatologa: Por favor, guarde las cajas en las que vienen los medicamentos de uso tpico para ayudarle a seguir las instrucciones sobre dnde y cmo usarlos. Las farmacias generalmente imprimen las instrucciones del medicamento slo en las cajas y   no directamente en los tubos del medicamento.   Si su medicamento es muy caro, por favor, pngase en contacto con nuestra oficina llamando al [phone] y presione la opcin 4 o envenos un mensaje a travs de MyChart.   No podemos decirle cul ser su copago por los medicamentos por adelantado ya que esto es diferente dependiendo de la cobertura de su seguro. Sin embargo, es posible que podamos encontrar un medicamento sustituto a menor costo o llenar un formulario para que el seguro cubra el medicamento que se considera necesario.   Si se requiere una autorizacin previa para que su compaa de seguros cubra su medicamento, por favor permtanos de 1 a 2 das hbiles para completar este proceso.  Los precios de los medicamentos varan con frecuencia dependiendo del lugar de dnde se surte la receta y alguna farmacias pueden ofrecer precios ms baratos.  El sitio web www.goodrx.com tiene cupones para medicamentos de diferentes farmacias. Los precios aqu no  tienen en cuenta lo que podra costar con la ayuda del seguro (puede ser ms barato con su seguro), pero el sitio web puede darle el precio si no utiliz ningn seguro.  - Puede imprimir el cupn correspondiente y llevarlo con su receta a la farmacia.  - Tambin puede pasar por nuestra oficina durante el horario de atencin regular y recoger una tarjeta de cupones de GoodRx.  - Si necesita que su receta se enve electrnicamente a una farmacia diferente, informe a nuestra oficina a travs de MyChart de [place] o por telfono llamando al [phone] y presione la opcin 4.  

## 2022-07-22 NOTE — Progress Notes (Signed)
Follow-Up Visit   Subjective  Scott Potts is a 77 y.o. male who presents for the following: Annual Exam.  The patient presents for Upper Body Skin Exam (UBSE) for skin cancer screening and mole check.  The patient has spots, moles and lesions to be evaluated, some may be new or changing. He has a few rough spots on his face. Also some growths on his back that are irritating and itchy. History of SCC of the right neck.    The following portions of the chart were reviewed this encounter and updated as appropriate:       Review of Systems:  No other skin or systemic complaints except as noted in HPI or Assessment and Plan.  Objective  Well appearing patient in no apparent distress; mood and affect are within normal limits.  All skin waist up examined.  mid back x 4, R post upper arm x 1; R hand dorsum x 1 (6) Erythematous stuck-on, waxy papule or plaque  Left Mid Back 9.50m pink brown keratotic papule        Assessment & Plan  Skin cancer screening performed today.  Actinic Damage - chronic, secondary to cumulative UV radiation exposure/sun exposure over time - diffuse scaly erythematous macules with underlying dyspigmentation - Recommend daily broad spectrum sunscreen SPF 30+ to sun-exposed areas, reapply every 2 hours as needed.  - Recommend staying in the shade or wearing long sleeves, sun glasses (UVA+UVB protection) and wide brim hats (4-inch brim around the entire circumference of the hat). - Call for new or changing lesions.  Lentigines - Scattered tan macules - Due to sun exposure - Benign-appearing, observe - Recommend daily broad spectrum sunscreen SPF 30+ to sun-exposed areas, reapply every 2 hours as needed. - Call for any changes  Seborrheic Keratoses - Stuck-on, waxy, tan-brown papules and/or plaques, including face - Benign-appearing - Discussed benign etiology and prognosis. - Observe - Call for any changes  History of Squamous Cell Carcinoma  of the Skin - No evidence of recurrence today of the right neck - Recommend regular full body skin exams - Recommend daily broad spectrum sunscreen SPF 30+ to sun-exposed areas, reapply every 2 hours as needed.  - Call if any new or changing lesions are noted between office visits  Melanocytic Nevi - Tan-brown and/or pink-flesh-colored symmetric macules and papules - Benign appearing on exam today - Observation - Call clinic for new or changing moles - Recommend daily use of broad spectrum spf 30+ sunscreen to sun-exposed areas.   Hemangiomas - Red papules - Discussed benign nature - Observe - Call for any changes  Inflamed seborrheic keratosis (6) mid back x 4, R post upper arm x 1; R hand dorsum x 1  Symptomatic, irritating, patient would like treated.  Destruction of lesion - mid back x 4, R post upper arm x 1; R hand dorsum x 1  Destruction method: cryotherapy   Informed consent: discussed and consent obtained   Lesion destroyed using liquid nitrogen: Yes   Region frozen until ice ball extended beyond lesion: Yes   Outcome: patient tolerated procedure well with no complications   Post-procedure details: wound care instructions given   Additional details:  Prior to procedure, discussed risks of blister formation, small wound, skin dyspigmentation, or rare scar following cryotherapy. Recommend Vaseline ointment to treated areas while healing.   Neoplasm of uncertain behavior of skin Left Mid Back  Epidermal / dermal shaving  Lesion diameter (cm):  0.9 Informed consent: discussed and  consent obtained   Patient was prepped and draped in usual sterile fashion: Area prepped with alcohol. Anesthesia: the lesion was anesthetized in a standard fashion   Anesthetic:  1% lidocaine w/ epinephrine 1-100,000 buffered w/ 8.4% NaHCO3 Instrument used: flexible razor blade   Hemostasis achieved with: pressure, aluminum chloride and electrodesiccation   Outcome: patient tolerated  procedure well   Post-procedure details: wound care instructions given   Post-procedure details comment:  Ointment and small bandage applied  Specimen 1 - Surgical pathology Differential Diagnosis: Inflamed SK r/o SCC Check Margins: No   Return in about 1 year (around 07/23/2023) for UBSE, Hx SCC.  IJamesetta Orleans, CMA, am acting as scribe for Brendolyn Patty, MD .  Documentation: I have reviewed the above documentation for accuracy and completeness, and I agree with the above.  Brendolyn Patty MD

## 2022-07-29 ENCOUNTER — Telehealth: Payer: Self-pay

## 2022-07-29 NOTE — Telephone Encounter (Signed)
-----  Message from Brendolyn Patty, MD sent at 07/29/2022  9:48 AM EST ----- Skin , left mid back WELL DIFFERENTIATED SQUAMOUS CELL CARCINOMA WITH SUPERFICIAL INFILTRATION  SCC skin cancer, needs EDC   - please call patient

## 2022-07-29 NOTE — Telephone Encounter (Signed)
Advised patient biopsy of the left mid back was SCC. EDC scheduled for 08/19/2022 at 8:30 AM.

## 2022-08-16 DIAGNOSIS — R430 Anosmia: Secondary | ICD-10-CM | POA: Diagnosis not present

## 2022-08-16 DIAGNOSIS — G40909 Epilepsy, unspecified, not intractable, without status epilepticus: Secondary | ICD-10-CM | POA: Diagnosis not present

## 2022-08-19 ENCOUNTER — Encounter: Payer: Self-pay | Admitting: Dermatology

## 2022-08-19 ENCOUNTER — Ambulatory Visit: Payer: Medicare HMO | Admitting: Dermatology

## 2022-08-19 VITALS — BP 118/71 | HR 80

## 2022-08-19 DIAGNOSIS — C44519 Basal cell carcinoma of skin of other part of trunk: Secondary | ICD-10-CM

## 2022-08-19 DIAGNOSIS — L820 Inflamed seborrheic keratosis: Secondary | ICD-10-CM | POA: Diagnosis not present

## 2022-08-19 DIAGNOSIS — L814 Other melanin hyperpigmentation: Secondary | ICD-10-CM | POA: Diagnosis not present

## 2022-08-19 DIAGNOSIS — L821 Other seborrheic keratosis: Secondary | ICD-10-CM

## 2022-08-19 DIAGNOSIS — L578 Other skin changes due to chronic exposure to nonionizing radiation: Secondary | ICD-10-CM

## 2022-08-19 DIAGNOSIS — C4492 Squamous cell carcinoma of skin, unspecified: Secondary | ICD-10-CM

## 2022-08-19 NOTE — Patient Instructions (Addendum)
Electrodesiccation and Curettage ("Scrape and Burn") Wound Care Instructions  Leave the original bandage on for 24 hours if possible.  If the bandage becomes soaked or soiled before that time, it is OK to remove it and examine the wound.  A small amount of post-operative bleeding is normal.  If excessive bleeding occurs, remove the bandage, place gauze over the site and apply continuous pressure (no peeking) over the area for 30 minutes. If this does not work, please call our clinic as soon as possible or page your doctor if it is after hours.   Once a day, cleanse the wound with soap and water. It is fine to shower. If a thick crust develops you may use a Q-tip dipped into dilute hydrogen peroxide (mix 1:1 with water) to dissolve it.  Hydrogen peroxide can slow the healing process, so use it only as needed.    After washing, apply petroleum jelly (Vaseline) or an antibiotic ointment if your doctor prescribed one for you, followed by a bandage.    For best healing, the wound should be covered with a layer of ointment at all times. If you are not able to keep the area covered with a bandage to hold the ointment in place, this may mean re-applying the ointment several times a day.  Continue this wound care until the wound has healed and is no longer open. It may take several weeks for the wound to heal and close.  Itching and mild discomfort is normal during the healing process.   Seborrheic Keratosis  What causes seborrheic keratoses? Seborrheic keratoses are harmless, common skin growths that first appear during adult life.  As time goes by, more growths appear.  Some people may develop a large number of them.  Seborrheic keratoses appear on both covered and uncovered body parts.  They are not caused by sunlight.  The tendency to develop seborrheic keratoses can be inherited.  They vary in color from skin-colored to gray, brown, or even black.  They can be either smooth or have a rough, warty  surface.   Seborrheic keratoses are superficial and look as if they were stuck on the skin.  Under the microscope this type of keratosis looks like layers upon layers of skin.  That is why at times the top layer may seem to fall off, but the rest of the growth remains and re-grows.    Treatment Seborrheic keratoses do not need to be treated, but can easily be removed in the office.  Seborrheic keratoses often cause symptoms when they rub on clothing or jewelry.  Lesions can be in the way of shaving.  If they become inflamed, they can cause itching, soreness, or burning.  Removal of a seborrheic keratosis can be accomplished by freezing, burning, or surgery. If any spot bleeds, scabs, or grows rapidly, please return to have it checked, as these can be an indication of a skin cancer.  Cryotherapy Aftercare  Wash gently with soap and water everyday.   Apply Vaseline and Band-Aid daily until healed.     If you have any discomfort, you can take Tylenol (acetaminophen) or ibuprofen as directed on the bottle. (Please do not take these if you have an allergy to them or cannot take them for another reason).  Some redness, tenderness and white or yellow material in the wound is normal healing.  If the area becomes very sore and red, or develops a thick yellow-green material (pus), it may be infected; please notify us.  Wound healing continues for up to one year following surgery. It is not unusual to experience pain in the scar from time to time during the interval.  If the pain becomes severe or the scar thickens, you should notify the office.    A slight amount of redness in a scar is expected for the first six months.  After six months, the redness will fade and the scar will soften and fade.  The color difference becomes less noticeable with time.  If there are any problems, return for a post-op surgery check at your earliest convenience.  To improve the appearance of the scar, you can use silicone  scar gel, cream, or sheets (such as Mederma or Serica) every night for up to one year. These are available over the counter (without a prescription).  Please call our office at [phone] for any questions or concerns.  Due to recent changes in healthcare laws, you may see results of your pathology and/or laboratory studies on MyChart before the doctors have had a chance to review them. We understand that in some cases there may be results that are confusing or concerning to you. Please understand that not all results are received at the same time and often the doctors may need to interpret multiple results in order to provide you with the best plan of care or course of treatment. Therefore, we ask that you please give [place] 2 business days to thoroughly review all your results before contacting the office for clarification. Should we see a critical lab result, you will be contacted sooner.   If You Need Anything After Your Visit  If you have any questions or concerns for your doctor, please call our main line at [phone] and press option 4 to reach your doctor's medical assistant. If no one answers, please leave a voicemail as directed and we will return your call as soon as possible. Messages left after 4 pm will be answered the following business day.   You may also send [place] a message via [place]. We typically respond to MyChart messages within 1-2 business days.  For prescription refills, please ask your pharmacy to contact our office. Our fax number is [phone].  If you have an urgent issue when the clinic is closed that cannot wait until the next business day, you can page your doctor at the number below.    Please note that while we do our best to be available for urgent issues outside of office hours, we are not available 24/7.   If you have an urgent issue and are unable to reach [place], you may choose to seek medical care at your doctor's office, retail clinic, urgent care center, or  emergency room.  If you have a medical emergency, please immediately call 911 or go to the emergency department.  Pager Numbers  - Dr. Nehemiah Massed: [phone]  - Dr. Laurence Ferrari: [phone]  - Dr. Nicole Kindred: [phone]  In the event of inclement weather, please call our main line at [phone] for an update on the status of any delays or closures.  Dermatology Medication Tips: Please keep the boxes that topical medications come in in order to help keep track of the instructions about where and how to use these. Pharmacies typically print the medication instructions only on the boxes and not directly on the medication tubes.   If your medication is too expensive, please contact our office at [phone] option 4 or send [place] a message through [place].   We are unable  to tell what your co-pay for medications will be in advance as this is different depending on your insurance coverage. However, we may be able to find a substitute medication at lower cost or fill out paperwork to get insurance to cover a needed medication.   If a prior authorization is required to get your medication covered by your insurance company, please allow [place] 1-2 business days to complete this process.  Drug prices often vary depending on where the prescription is filled and some pharmacies may offer cheaper prices.  The website www.goodrx.com contains coupons for medications through different pharmacies. The prices here do not account for what the cost may be with help from insurance (it may be cheaper with your insurance), but the website can give you the price if you did not use any insurance.  - You can print the associated coupon and take it with your prescription to the pharmacy.  - You may also stop by our office during regular business hours and pick up a GoodRx coupon card.  - If you need your prescription sent electronically to a different pharmacy, notify our office through [facility] or by phone at  [phone] option 4.     Si Usted Necesita Algo Despus de Su Visita  Tambin puede enviarnos un mensaje a travs de Pharmacist, community. Por lo general respondemos a los mensajes de MyChart en el transcurso de 1 a 2 das hbiles.  Para renovar recetas, por favor pida a su farmacia que se ponga en contacto con nuestra oficina. Harland Dingwall de fax es [place] [phone].  Si tiene un asunto urgente cuando la clnica est cerrada y que no puede esperar hasta el siguiente da hbil, puede llamar/localizar a su doctor(a) al nmero que aparece a continuacin.   Por favor, tenga en cuenta que aunque hacemos todo lo posible para estar disponibles para asuntos urgentes fuera del horario de [place], no estamos disponibles las 24 horas del da, los 7 das de la [place].   Si tiene un problema urgente y no puede comunicarse con nosotros, puede optar por buscar atencin mdica  en el consultorio de su doctor(a), en una clnica privada, en un centro de atencin urgente o en una sala de emergencias.  Si tiene Engineering geologist, por favor llame inmediatamente al 911 o vaya a la sala de emergencias.  Nmeros de bper  - Dr. Nehemiah Massed: [phone]  - Dra. Moye: [phone]  - Dra. Nicole Kindred: [phone]  En caso de inclemencias del [place], por favor llame a Johnsie Kindred principal al [phone] para una actualizacin sobre el [place] de cualquier retraso o cierre.  Consejos para la medicacin en dermatologa: Por favor, guarde las cajas en las que vienen los medicamentos de uso tpico para ayudarle a seguir las instrucciones sobre dnde y cmo usarlos. Las farmacias generalmente imprimen las instrucciones del medicamento slo en las cajas y no directamente en los tubos del [place].   Si su medicamento es muy caro, por favor, pngase en contacto con Zigmund Daniel llamando al [phone] y presione la opcin 4 o envenos un mensaje a travs de Pharmacist, community.   No podemos decirle cul ser su copago por los  medicamentos por adelantado ya que esto es diferente dependiendo de la cobertura de su seguro. Sin embargo, es posible que podamos encontrar un medicamento sustituto a Electrical engineer un formulario para que el seguro cubra el medicamento que se considera necesario.   Si se requiere una autorizacin previa para que su compaa de seguros  cubra su medicamento, por favor permtanos de 1 a 2 das hbiles para completar este proceso.  Los precios de los medicamentos varan con frecuencia dependiendo del Environmental consultant de dnde se surte la receta y alguna farmacias pueden ofrecer precios ms baratos.  El sitio web www.goodrx.com tiene cupones para medicamentos de Airline pilot. Los precios aqu no tienen en cuenta lo que podra costar con la ayuda del seguro (puede ser ms barato con su seguro), pero el sitio web puede darle el precio si no utiliz Research scientist (physical sciences).  - Puede imprimir el cupn correspondiente y llevarlo con su receta a la farmacia.  - Tambin puede pasar por nuestra oficina durante el horario de atencin regular y Charity fundraiser una tarjeta de cupones de GoodRx.  - Si necesita que su receta se enve electrnicamente a una farmacia diferente, informe a nuestra oficina a travs de MyChart de [place] o por telfono llamando al [phone] y presione la opcin 4.

## 2022-08-19 NOTE — Progress Notes (Signed)
Follow-Up Visit   Subjective  Scott Potts is a 77 y.o. male who presents for the following: Other (Patient here for treatment of bx proven scc at left mid back.  Patient also reports some areas on right chest,  right shoulder, and right side he would like treated. ).  They are itchy and irritated.  The patient has spots, moles and lesions to be evaluated, some may be new or changing and the patient has concerns that these could be cancer.  The following portions of the chart were reviewed this encounter and updated as appropriate:      Review of Systems: No other skin or systemic complaints.  Objective  Well appearing patient in no apparent distress; mood and affect are within normal limits.  A full examination was performed including scalp, head, eyes, ears, nose, lips, neck, chest, axillae, abdomen, back, buttocks, bilateral upper extremities, bilateral lower extremities, hands, feet, fingers, toes, fingernails, and toenails. All findings within normal limits unless otherwise noted below.  left mid back Pink scar, identified with pre-bx photo  right posterior shoulder x 5, right flank x 4, right chest x  1 (10) Erythematous stuck-on, waxy papule or plaque   Assessment & Plan  Squamous cell carcinoma of skin left mid back  Destruction of lesion  Destruction method: electrodesiccation and curettage   Informed consent: discussed and consent obtained   Timeout:  patient name, date of birth, surgical site, and procedure verified Curettage performed in three different directions: Yes   Electrodesiccation performed over the curetted area: Yes   Final wound size (cm):  1.1 Hemostasis achieved with:  pressure, aluminum chloride and electrodesiccation Outcome: patient tolerated procedure well with no complications   Post-procedure details: wound care instructions given   Additional details:  Mupirocin ointment and Bandaid applied   Bx proven well differentiated SCC  Treat  with ED&C today    Inflamed seborrheic keratosis (10) right posterior shoulder x 5, right flank x 4, right chest x  1  Symptomatic, irritating, patient would like treated.  Destruction of lesion - right posterior shoulder x 5, right flank x 4, right chest x  1  Destruction method: cryotherapy   Informed consent: discussed and consent obtained   Lesion destroyed using liquid nitrogen: Yes   Region frozen until ice ball extended beyond lesion: Yes   Outcome: patient tolerated procedure well with no complications   Post-procedure details: wound care instructions given   Additional details:  Prior to procedure, discussed risks of blister formation, small wound, skin dyspigmentation, or rare scar following cryotherapy. Recommend Vaseline ointment to treated areas while healing.    Seborrheic Keratoses - Stuck-on, waxy, tan-brown papules and/or plaques  - Benign-appearing - Discussed benign etiology and prognosis. - Observe - Call for any changes  Lentigines - Scattered tan macules - Due to sun exposure - Benign-appearing, observe - Recommend daily broad spectrum sunscreen SPF 30+ to sun-exposed areas, reapply every 2 hours as needed. - Call for any changes  Actinic Damage - chronic, secondary to cumulative UV radiation exposure/sun exposure over time - diffuse scaly erythematous macules with underlying dyspigmentation - Recommend daily broad spectrum sunscreen SPF 30+ to sun-exposed areas, reapply every 2 hours as needed.  - Recommend staying in the shade or wearing long sleeves, sun glasses (UVA+UVB protection) and wide brim hats (4-inch brim around the entire circumference of the hat). - Call for new or changing lesions.  Return for 6 month upper body exam.  I, Ruthell Rummage, CMA,  am acting as scribe for Brendolyn Patty, MD.  Documentation: I have reviewed the above documentation for accuracy and completeness, and I agree with the above.  Brendolyn Patty MD

## 2022-10-01 DIAGNOSIS — M65841 Other synovitis and tenosynovitis, right hand: Secondary | ICD-10-CM | POA: Diagnosis not present

## 2022-10-01 DIAGNOSIS — M720 Palmar fascial fibromatosis [Dupuytren]: Secondary | ICD-10-CM | POA: Diagnosis not present

## 2022-12-17 ENCOUNTER — Ambulatory Visit: Payer: Medicare HMO

## 2022-12-17 VITALS — BP 120/80 | Ht 73.0 in | Wt 181.5 lb

## 2022-12-17 DIAGNOSIS — Z0000 Encounter for general adult medical examination without abnormal findings: Secondary | ICD-10-CM | POA: Diagnosis not present

## 2022-12-17 NOTE — Progress Notes (Signed)
Subjective:   Scott Potts is a 77 y.o. male who presents for Medicare Annual/Subsequent preventive examination.  Review of Systems    Cardiac Risk Factors include: advanced age (>26men, >62 women);male gender    Objective:    Today's Vitals   12/17/22 1447  BP: 120/80  Weight: 181 lb 8 oz (82.3 kg)  Height: 6\' 1"  (1.854 m)   Body mass index is 23.95 kg/m.     12/17/2022    2:57 PM 08/25/2021    4:29 PM 07/29/2019    1:23 PM 07/27/2019    6:22 AM 07/21/2019    7:58 AM 06/25/2018    2:55 PM 06/23/2017    9:42 AM  Advanced Directives  Does Patient Have a Medical Advance Directive? Yes No Yes Yes Yes Yes Yes  Type of Estate agent of [place];Living will  Healthcare Power of [place];Living will Healthcare Power of [employer] Power of Attorney Living will Living will  Does patient want to make changes to medical advance directive?    No - Patient declined No - Patient declined    Copy of Healthcare Power of Attorney in Chart?   No - copy requested No - copy requested No - copy requested      Current Medications (verified) Outpatient Encounter Medications as of 12/17/2022  Medication Sig   levETIRAcetam (KEPPRA) 500 MG tablet Take 500 mg by mouth 2 (two) times daily.    Multiple Vitamin (MULTIVITAMIN WITH MINERALS) TABS tablet Take 1 tablet by mouth daily.   Omega-3 Fatty Acids (RA FISH OIL) 1000 MG CAPS Take 1,000 mg by mouth 2 (two) times daily.    No facility-administered encounter medications on file as of 12/17/2022.    Allergies (verified) Penicillins   History: Past Medical History:  Diagnosis Date   History of kidney stones    History of nephrolithiasis    History of vein stripping    Mitral valve regurgitation    Seizure (HCC)    6 months ago while sleeping   Squamous cell carcinoma of skin 06/02/2018   right neck, superficially invasive well differentiated   Squamous cell carcinoma of skin 07/22/2022   left mid back, EDC done   08/19/22   Thrombophlebitis of lower extremities    left   Past Surgical History:  Procedure Laterality Date   CARDIAC CATHETERIZATION  01/05/2008   COLONOSCOPY  07/09/2003   endocardiogram  07/09/2007   heart valve repair  07/09/2007   SPERMATOCELECTOMY Left 07/27/2019   Procedure: SPERMATOCELECTOMY;  Surgeon: Riki Altes, MD;  Location: [facility] ORS;  Service: Urology;  Laterality: Left;   VARICOSE VEIN SURGERY     vein strip left leg   Family History  Problem Relation Age of Onset   Prostate cancer Brother    Prostate cancer Brother    Early death Sister    Colon cancer Neg Hx    Social History   Socioeconomic History   Marital status: Married    Spouse name: Scott Potts   Number of children: 2   Years of education: Not on file   Highest education level: Bachelor's degree (e.g., BA, AB, BS)  Occupational History   Occupation: Retired    Associate Professor: [employer]  Tobacco Use   Smoking status: Former   Smokeless tobacco: Never   Tobacco comments:    quit 2009- 1-2 a day  Vaping Use   Vaping Use: Never used  Substance and Sexual Activity   Alcohol use: Yes    Comment:  rare / maybe 2 beers a month   Drug use: No   Sexual activity: Not Currently    Birth control/protection: None  Other Topics Concern   Not on file  Social History Narrative   Pt lives alone. Moderate amount of caffeine daily. Regular exercise.   Social Determinants of Health   Financial Resource Strain: Low Risk  (12/13/2022)   Overall Financial Resource Strain (CARDIA)    Difficulty of Paying Living Expenses: Not hard at all  Food Insecurity: No Food Insecurity (12/13/2022)   Hunger Vital Sign    Worried About Running Out of Food in the Last Year: Never true    Ran Out of Food in the Last Year: Never true  Transportation Needs: No Transportation Needs (12/13/2022)   PRAPARE - Administrator, Civil Service (Medical): No    Lack of Transportation (Non-Medical): No  Physical Activity:  Insufficiently Active (07/29/2019)   Exercise Vital Sign    Days of Exercise per Week: 7 days    Minutes of Exercise per Session: 20 min  Stress: No Stress Concern Present (12/13/2022)   [employer] of Occupational Health - Occupational Stress Questionnaire    Feeling of Stress : Not at all  Social Connections: Unknown (12/13/2022)   Social Connection and Isolation Panel [NHANES]    Frequency of Communication with Friends and Family: Twice a week    Frequency of Social Gatherings with Friends and Family: More than three times a week    Attends Religious Services: Not on Marketing executive or Organizations: Yes    Attends Engineer, structural: More than 4 times per year    Marital Status: Married    Tobacco Counseling   Clinical Intake:  Pre-visit preparation completed: Yes  Pain : No/denies pain   BMI - recorded: 23.95 Nutritional Status: BMI of 19-24  Normal Nutritional Risks: None Diabetes: No  How often do you need to have someone help you when you read instructions, pamphlets, or other written materials from your doctor or pharmacy?: 1 - Never  Diabetic?no  Interpreter Needed?: No  Comments: lives with wife Information entered by :: B.Zahriah Roes,LPN   Activities of Daily Living    12/13/2022    7:28 PM 12/06/2022    7:06 AM  In your present state of health, do you have any difficulty performing the following activities:  Hearing? 0 0  Vision? 0 0  Difficulty concentrating or making decisions? 0 0  Walking or climbing stairs? 0 0  Dressing or bathing? 0 0  Doing errands, shopping? 0 0  Preparing Food and eating ? N N  Using the Toilet? N N  In the past six months, have you accidently leaked urine? N N  Do you have problems with loss of bowel control? N N  Managing your Medications? N N  Managing your Finances? N N  Housekeeping or managing your Housekeeping? N N    Patient Care Team: Alfredia Ferguson, PA-C as PCP - General (Physician  Assistant) Lonell Face, MD as Consulting Physician (Neurology) Riki Altes, MD (Urology)  Indicate any recent Medical Services you may have received from other than [facility] providers in the past year (date may be approximate).     Assessment:   This is a routine wellness examination for [name].  Hearing/Vision screen Hearing Screening - Comments:: Adequate hearing Vision Screening - Comments:: Adequate vision [facility]  Dietary issues and exercise activities discussed: Current Exercise Habits: Home  exercise routine;Structured exercise class, Time (Minutes): > 60, Frequency (Times/Week): 7, Weekly Exercise (Minutes/Week): 0, Intensity: Mild   Goals Addressed             This Visit's Progress    DIET - INCREASE WATER INTAKE   On track    Recommend increasing water intake to 4-6 glasses of water a day.        Depression Screen    12/17/2022    2:55 PM 12/10/2021    8:42 AM 12/07/2020   10:00 AM 07/29/2019    1:24 PM 07/29/2019    1:19 PM 06/25/2018    3:04 PM 06/25/2018    2:58 PM  PHQ 2/9 Scores  PHQ - 2 Score 0 0 0 0 0 0 0  PHQ- 9 Score  0 0   0     Fall Risk    12/13/2022    7:28 PM 12/06/2022    7:06 AM 12/10/2021    8:42 AM 12/03/2021    6:18 PM 12/07/2020   10:00 AM  Fall Risk   Falls in the past year? 0 0  0 0  Number falls in past yr: 0 0 0 0 0  Injury with Fall? 0 0 0 0 0  Risk for fall due to :   No Fall Risks  No Fall Risks  Follow up     Falls evaluation completed    FALL RISK PREVENTION PERTAINING TO THE HOME:  Any stairs in or around the home? Yes  If so, are there any without handrails? Yes  Home free of loose throw rugs in walkways, pet beds, electrical cords, etc? Yes  Adequate lighting in your home to reduce risk of falls? Yes   ASSISTIVE DEVICES UTILIZED TO PREVENT FALLS:  Life alert? No  Use of a cane, walker or w/c? No  Grab bars in the bathroom? Yes  Shower chair or bench in shower? No  Elevated toilet seat or a handicapped toilet?  No   TIMED UP AND GO:  Was the test performed? Yes .  Length of time to ambulate 10 feet: 7 sec.   Gait steady and fast without use of assistive device  Cognitive Function:        12/17/2022    2:57 PM  6CIT Screen  What Year? 0 points  What month? 0 points  What time? 0 points  Count back from 20 0 points  Months in reverse 0 points  Repeat phrase 0 points  Total Score 0 points    Immunizations Immunization History  Administered Date(s) Administered   Influenza, High Dose Seasonal PF 03/31/2014, 06/19/2015, 06/17/2016, 06/23/2017, 06/25/2018   Influenza,inj,Quad PF,6+ Mos 08/23/2019   Influenza-Unspecified 04/12/2020   PFIZER(Purple Top)SARS-COV-2 Vaccination 10/19/2019, 11/09/2019, 06/14/2020   Pneumococcal Conjugate-13 12/13/2013   Pneumococcal Polysaccharide-23 12/09/2012   Tdap 11/05/2005, 11/25/2008, 08/25/2021    TDAP status: Up to date  Flu Vaccine status: Up to date  Pneumococcal vaccine status: Up to date  Covid-19 vaccine status: Declined, Education has been provided regarding the importance of this vaccine but patient still declined. Advised may receive this vaccine at local pharmacy or Health Dept.or vaccine clinic. Aware to provide a copy of the vaccination record if obtained from local pharmacy or Health Dept. Verbalized acceptance and understanding.  Qualifies for Shingles Vaccine? Yes   Zostavax completed No   Shingrix Completed?: No.    Education has been provided regarding the importance of this vaccine. Patient has been advised to call insurance company  to determine out of pocket expense if they have not yet received this vaccine. Advised may also receive vaccine at local pharmacy or Health Dept. Verbalized acceptance and understanding.  Screening Tests Health Maintenance  Topic Date Due   Zoster Vaccines- Shingrix (1 of 2) Never done   COVID-19 Vaccine (4 - 2023-24 season) 03/08/2022   INFLUENZA VACCINE  02/06/2023   Medicare Annual Wellness  (AWV)  12/17/2023   Colonoscopy  02/10/2024   DTaP/Tdap/Td (4 - Td or Tdap) 08/26/2031   Pneumonia Vaccine 39+ Years old  Completed   Hepatitis C Screening  Completed   HPV VACCINES  Aged Out    Health Maintenance  Health Maintenance Due  Topic Date Due   Zoster Vaccines- Shingrix (1 of 2) Never done   COVID-19 Vaccine (4 - 2023-24 season) 03/08/2022    Colorectal cancer screening: No longer required.   Lung Cancer Screening: (Low Dose CT Chest recommended if Age 76-80 years, 30 pack-year currently smoking OR have quit w/in 15years.) does not qualify.   Lung Cancer Screening Referral: no  Additional Screening:  Hepatitis C Screening: does not qualify; Completed yes  Vision Screening: Recommended annual ophthalmology exams for early detection of glaucoma and other disorders of the eye. Is the patient up to date with their annual eye exam?  Yes  Who is the provider or what is the name of the office in which the patient attends annual eye exams? [facility] If pt is not established with a provider, would they like to be referred to a provider to establish care? No .   Dental Screening: Recommended annual dental exams for proper oral hygiene  Community Resource Referral / Chronic Care Management: CRR required this visit?  No   CCM required this visit?  No     Plan:     I have personally reviewed and noted the following in the patient's chart:   Medical and social history Use of alcohol, tobacco or illicit drugs  Current medications and supplements including opioid prescriptions. Patient is not currently taking opioid prescriptions. Functional ability and status Nutritional status Physical activity Advanced directives List of other physicians Hospitalizations, surgeries, and ER visits in previous 12 months Vitals Screenings to include cognitive, depression, and falls Referrals and appointments  In addition, I have reviewed and discussed with patient certain  preventive protocols, quality metrics, and best practice recommendations. A written personalized care plan for preventive services as well as general preventive health recommendations were provided to patient.     Sue Lush, LPN   [id number]/95/2841   Nurse Notes: .The patient states he is doing well and has no concerns or questions at this time.

## 2022-12-17 NOTE — Patient Instructions (Signed)
[name] , Thank you for taking time to come for your Medicare Wellness Visit. I appreciate your ongoing commitment to your health goals. Please review the following plan we discussed and let me know if I can assist you in the future.   These are the goals we discussed:  Goals      DIET - INCREASE WATER INTAKE     Recommend increasing water intake to 4-6 glasses of water a day.         This is a list of the screening recommended for you and due dates:  Health Maintenance  Topic Date Due   Zoster (Shingles) Vaccine (1 of 2) Never done   COVID-19 Vaccine (4 - 2023-24 season) 03/08/2022   Flu Shot  02/06/2023   Medicare Annual Wellness Visit  12/17/2023   Colon Cancer Screening  02/10/2024   DTaP/Tdap/Td vaccine (4 - Td or Tdap) 08/26/2031   Pneumonia Vaccine  Completed   Hepatitis C Screening  Completed   HPV Vaccine  Aged Out    Advanced directives: yes  Conditions/risks identified: none  Next appointment: Follow up in one year for your annual wellness visit. 12/22/2023 @ 3pm in person  Preventive Care 76 Years and Older, Male  Preventive care refers to lifestyle choices and visits with your health care provider that can promote health and wellness. What does preventive care include? A yearly physical exam. This is also called an annual well check. Dental exams once or twice a year. Routine eye exams. Ask your health care provider how often you should have your eyes checked. Personal lifestyle choices, including: Daily care of your teeth and gums. Regular physical activity. Eating a healthy diet. Avoiding tobacco and drug use. Limiting alcohol use. Practicing safe sex. Taking low doses of aspirin every day. Taking vitamin and mineral supplements as recommended by your health care provider. What happens during an annual well check? The services and screenings done by your health care provider during your annual well check will depend on your age, overall health,  lifestyle risk factors, and family history of disease. Counseling  Your health care provider may ask you questions about your: Alcohol use. Tobacco use. Drug use. Emotional well-being. Home and relationship well-being. Sexual activity. Eating habits. History of falls. Memory and ability to understand (cognition). Work and work Astronomer. Screening  You may have the following tests or measurements: Height, weight, and BMI. Blood pressure. Lipid and cholesterol levels. These may be checked every 5 years, or more frequently if you are over 45 years old. Skin check. Lung cancer screening. You may have this screening every year starting at age 76 if you have a 30-pack-year history of smoking and currently smoke or have quit within the past 15 years. Fecal occult blood test (FOBT) of the stool. You may have this test every year starting at age 76. Flexible sigmoidoscopy or colonoscopy. You may have a sigmoidoscopy every 5 years or a colonoscopy every 10 years starting at age 76. Prostate cancer screening. Recommendations will vary depending on your family history and other risks. Hepatitis C blood test. Hepatitis B blood test. Sexually transmitted disease (STD) testing. Diabetes screening. This is done by checking your blood sugar (glucose) after you have not eaten for a while (fasting). You may have this done every 1-3 years. Abdominal aortic aneurysm (AAA) screening. You may need this if you are a current or former smoker. Osteoporosis. You may be screened starting at age 76 if you are at high risk. Talk  with your health care provider about your test results, treatment options, and if necessary, the need for more tests. Vaccines  Your health care provider may recommend certain vaccines, such as: Influenza vaccine. This is recommended every year. Tetanus, diphtheria, and acellular pertussis (Tdap, Td) vaccine. You may need a Td booster every 10 years. Zoster vaccine. You may need this  after age 76. Pneumococcal 13-valent conjugate (PCV13) vaccine. One dose is recommended after age 76. Pneumococcal polysaccharide (PPSV23) vaccine. One dose is recommended after age 1. Talk to your health care provider about which screenings and vaccines you need and how often you need them. This information is not intended to replace advice given to you by your health care provider. Make sure you discuss any questions you have with your health care provider. Document Released: 07/21/2015 Document Revised: 03/13/2016 Document Reviewed: 04/25/2015 Elsevier Interactive Patient Education  2017 [employer].  Fall Prevention in the Home Falls can cause injuries. They can happen to people of all ages. There are many things you can do to make your home safe and to help prevent falls. What can I do on the outside of my home? Regularly fix the edges of walkways and driveways and fix any cracks. Remove anything that might make you trip as you walk through a door, such as a raised step or threshold. Trim any bushes or trees on the path to your home. Use bright outdoor lighting. Clear any walking paths of anything that might make someone trip, such as rocks or tools. Regularly check to see if handrails are loose or broken. Make sure that both sides of any steps have handrails. Any raised decks and porches should have guardrails on the edges. Have any leaves, snow, or ice cleared regularly. Use sand or salt on walking paths during winter. Clean up any spills in your garage right away. This includes oil or grease spills. What can I do in the bathroom? Use night lights. Install grab bars by the toilet and in the tub and shower. Do not use towel bars as grab bars. Use non-skid mats or decals in the tub or shower. If you need to sit down in the shower, use a plastic, non-slip stool. Keep the floor dry. Clean up any water that spills on the floor as soon as it happens. Remove soap buildup in the tub or  shower regularly. Attach bath mats securely with double-sided non-slip rug tape. Do not have throw rugs and other things on the floor that can make you trip. What can I do in the bedroom? Use night lights. Make sure that you have a light by your bed that is easy to reach. Do not use any sheets or blankets that are too big for your bed. They should not hang down onto the floor. Have a firm chair that has side arms. You can use this for support while you get dressed. Do not have throw rugs and other things on the floor that can make you trip. What can I do in the kitchen? Clean up any spills right away. Avoid walking on wet floors. Keep items that you use a lot in easy-to-reach places. If you need to reach something above you, use a strong step stool that has a grab bar. Keep electrical cords out of the way. Do not use floor polish or wax that makes floors slippery. If you must use wax, use non-skid floor wax. Do not have throw rugs and other things on the floor that can make you  trip. What can I do with my stairs? Do not leave any items on the stairs. Make sure that there are handrails on both sides of the stairs and use them. Fix handrails that are broken or loose. Make sure that handrails are as long as the stairways. Check any carpeting to make sure that it is firmly attached to the stairs. Fix any carpet that is loose or worn. Avoid having throw rugs at the top or bottom of the stairs. If you do have throw rugs, attach them to the floor with carpet tape. Make sure that you have a light switch at the top of the stairs and the bottom of the stairs. If you do not have them, ask someone to add them for you. What else can I do to help prevent falls? Wear shoes that: Do not have high heels. Have rubber bottoms. Are comfortable and fit you well. Are closed at the toe. Do not wear sandals. If you use a stepladder: Make sure that it is fully opened. Do not climb a closed stepladder. Make  sure that both sides of the stepladder are locked into place. Ask someone to hold it for you, if possible. Clearly mark and make sure that you can see: Any grab bars or handrails. First and last steps. Where the edge of each step is. Use tools that help you move around (mobility aids) if they are needed. These include: Canes. Walkers. Scooters. Crutches. Turn on the lights when you go into a dark area. Replace any light bulbs as soon as they burn out. Set up your furniture so you have a clear path. Avoid moving your furniture around. If any of your floors are uneven, fix them. If there are any pets around you, be aware of where they are. Review your medicines with your doctor. Some medicines can make you feel dizzy. This can increase your chance of falling. Ask your doctor what other things that you can do to help prevent falls. This information is not intended to replace advice given to you by your health care provider. Make sure you discuss any questions you have with your health care provider. Document Released: 04/20/2009 Document Revised: 11/30/2015 Document Reviewed: 07/29/2014 Elsevier Interactive Patient Education  2017 [employer].

## 2022-12-30 ENCOUNTER — Ambulatory Visit (INDEPENDENT_AMBULATORY_CARE_PROVIDER_SITE_OTHER): Payer: Medicare HMO | Admitting: Family Medicine

## 2022-12-30 ENCOUNTER — Encounter: Payer: Self-pay | Admitting: Family Medicine

## 2022-12-30 VITALS — BP 130/90 | HR 80 | Temp 97.9°F | Ht 73.0 in | Wt 179.4 lb

## 2022-12-30 DIAGNOSIS — Z0000 Encounter for general adult medical examination without abnormal findings: Secondary | ICD-10-CM | POA: Diagnosis not present

## 2022-12-30 DIAGNOSIS — G40909 Epilepsy, unspecified, not intractable, without status epilepticus: Secondary | ICD-10-CM | POA: Diagnosis not present

## 2022-12-30 DIAGNOSIS — Z8042 Family history of malignant neoplasm of prostate: Secondary | ICD-10-CM | POA: Diagnosis not present

## 2022-12-30 NOTE — Progress Notes (Signed)
New patient visit   Patient: Scott Potts   DOB: 07/05/1946   77 y.o. Male  MRN: [id number] Visit Date: 12/30/2022  Today's healthcare provider: Sherlyn Hay, DO   Chief Complaint  Patient presents with   Annual Exam   Subjective    Scott Potts is a 77 y.o. male who presents today as a new patient to establish care.  HPI HPI   Pt is here for his yearly physical. Pt marked "ringing in ears" on ROS Last edited by Daneen Schick, CMA on 12/30/2022  8:17 AM.    Tinnitus in left ear; has at baseline, unchanged.  No problems sleeping Exercise at least twice  day at home, plays golf five times a weeks, days he doesn't play golf, he walks five miles  Diet: Special K cereal daily with almond milk, sandwich for lunch Does not drink caffeine (decaf coffee with cereal) Rarely goes to places like [place] Two meals daily; rarely eats after five o'clock Occasional salad later in the day Approx.1-2 beers per month  Sees Dr. Sherryll Burger Neuro annually, last in February, last seizure in 2015 (had quit taking Keppra; second seizure in life).  Drinks a lot of water; no kidney stones in 25-30 years Due for colonoscopy 02/2024  Denies any new concerns/problems he wants to address today.   Past Medical History:  Diagnosis Date   History of kidney stones    History of nephrolithiasis    History of vein stripping    Mitral valve regurgitation    Seizure (HCC)    6 months ago while sleeping   Squamous cell carcinoma of skin 06/02/2018   right neck, superficially invasive well differentiated   Squamous cell carcinoma of skin 07/22/2022   left mid back, EDC done  08/19/22   Thrombophlebitis of lower extremities    left   Past Surgical History:  Procedure Laterality Date   CARDIAC CATHETERIZATION  01/05/2008   COLONOSCOPY  07/09/2003   endocardiogram  07/09/2007   heart valve repair  07/09/2007   SPERMATOCELECTOMY Left 07/27/2019   Procedure: SPERMATOCELECTOMY;  Surgeon:  Riki Altes, MD;  Location: [facility] OR[facility];  Service: Urology;  Laterality: Left;   VARICOSE VEIN SURGERY     vein strip left leg   Family Status  Relation Name Status   Mother  Alive   Father  Deceased   Sister  Alive   Brother  Alive   Brother  Alive   Sister Kinnick Maus (Not Specified)   Neg Hx  (Not Specified)   Family History  Problem Relation Age of Onset   Prostate cancer Brother    Prostate cancer Brother    Early death Sister    Colon cancer Neg Hx    Social History   Socioeconomic History   Marital status: Married    Spouse name: [name]   Number of children: 2   Years of education: Not on file   Highest education level: Bachelor's degree (e.g., BA, AB, BS)  Occupational History   Occupation: Retired    Associate Professor: [employer]  Tobacco Use   Smoking status: Former   Smokeless tobacco: Never   Tobacco comments:    quit 2009- 1-2 a day  Vaping Use   Vaping Use: Never used  Substance and Sexual Activity   Alcohol use: Yes    Comment: rare / maybe 2 beers a month   Drug use: No   Sexual activity: Not Currently    Birth control/protection: None  Other Topics Concern   Not on file  Social History Narrative   Pt lives alone. Moderate amount of caffeine daily. Regular exercise.   Social Determinants of Health   Financial Resource Strain: Low Risk  (12/13/2022)   Overall Financial Resource Strain (CARDIA)    Difficulty of Paying Living Expenses: Not hard at all  Food Insecurity: No Food Insecurity (12/13/2022)   Hunger Vital Sign    Worried About Running Out of Food in the Last Year: Never true    Ran Out of Food in the Last Year: Never true  Transportation Needs: No Transportation Needs (12/13/2022)   PRAPARE - Administrator, Civil Service (Medical): No    Lack of Transportation (Non-Medical): No  Physical Activity: Insufficiently Active (07/29/2019)   Exercise Vital Sign    Days of Exercise per Week: 7 days    Minutes of Exercise per  Session: 20 min  Stress: No Stress Concern Present (12/13/2022)   [employer] of Occupational Health - Occupational Stress Questionnaire    Feeling of Stress : Not at all  Social Connections: Unknown (12/13/2022)   Social Connection and Isolation Panel [NHANES]    Frequency of Communication with Friends and Family: Twice a week    Frequency of Social Gatherings with Friends and Family: More than three times a week    Attends Religious Services: Not on Marketing executive or Organizations: Yes    Attends Banker Meetings: More than 4 times per year    Marital Status: Married   Outpatient Medications Prior to Visit  Medication Sig   levETIRAcetam (KEPPRA) 500 MG tablet Take 500 mg by mouth 2 (two) times daily.    Multiple Vitamin (MULTIVITAMIN WITH MINERALS) TABS tablet Take 1 tablet by mouth daily.   Omega-3 Fatty Acids (RA FISH OIL) 1000 MG CAPS Take 1,000 mg by mouth 2 (two) times daily.    oxymetazoline (AFRIN) 0.05 % nasal spray Place 1 spray into both nostrils 2 (two) times daily. Uses as needed   Plant Sterol Stanol-Pantethine (CHOLEST OFF COMPLETE PO)    No facility-administered medications prior to visit.   Allergies  Allergen Reactions   Penicillins Other (See Comments)    Unknown reaction, childhood reaction Did it involve swelling of the face/tongue/throat, SOB, or low BP? Unknown Did it involve sudden or severe rash/hives, skin peeling, or any reaction on the inside of your mouth or nose? Unknown Did you need to seek medical attention at a hospital or doctor's office? Unknown When did it last happen?childhood reaction.   If all above answers are "NO", may proceed with cephalosporin use.     Immunization History  Administered Date(s) Administered   Influenza, High Dose Seasonal PF 03/31/2014, 06/19/2015, 06/17/2016, 06/23/2017, 06/25/2018   Influenza,inj,Quad PF,6+ Mos 08/23/2019   Influenza-Unspecified 04/12/2020   PFIZER(Purple  Top)SARS-COV-2 Vaccination 10/19/2019, 11/09/2019, 06/14/2020   Pneumococcal Conjugate-13 12/13/2013   Pneumococcal Polysaccharide-23 12/09/2012   Tdap 11/05/2005, 11/25/2008, 08/25/2021    Health Maintenance  Topic Date Due   INFLUENZA VACCINE  02/06/2023   Medicare Annual Wellness (AWV)  12/17/2023   Colonoscopy  02/10/2024   DTaP/Tdap/Td (4 - Td or Tdap) 08/26/2031   Pneumonia Vaccine 9+ Years old  Completed   Hepatitis C Screening  Completed   HPV VACCINES  Aged Out   COVID-19 Vaccine  Discontinued   Zoster Vaccines- Shingrix  Discontinued    Patient Care Team: Sherlyn Hay, DO as PCP - General (Family  Medicine) Lonell Face, MD as Consulting Physician (Neurology) Riki Altes, MD (Urology)  Review of Systems  Constitutional:  Negative for activity change, appetite change, chills, fatigue and fever.  HENT:  Positive for congestion and tinnitus (left). Negative for ear discharge and ear pain.   Eyes:  Negative for visual disturbance.  Respiratory:  Negative for shortness of breath and wheezing.   Cardiovascular:  Negative for chest pain.  Gastrointestinal:  Negative for abdominal pain.  Genitourinary:  Negative for dysuria.  Musculoskeletal:  Negative for arthralgias and myalgias.  Skin:  Negative for color change and rash.  Allergic/Immunologic: Positive for environmental allergies.  Neurological:  Negative for dizziness, light-headedness, numbness and headaches.       Objective    BP (!) 130/90 (BP Location: Left Arm, Patient Position: Sitting, Cuff Size: Normal)   Pulse 80   Temp 97.9 F (36.6 C) (Oral)   Ht 6\' 1"  (1.854 m)   Wt 179 lb 6.4 oz (81.4 kg)   SpO2 98%   BMI 23.67 kg/m    Physical Exam Vitals reviewed.  Constitutional:      General: He is not in acute distress.    Appearance: Normal appearance. He is well-developed. He is not diaphoretic.  HENT:     Head: Normocephalic and atraumatic.     Right Ear: Tympanic membrane, ear canal  and external ear normal.     Left Ear: Tympanic membrane, ear canal and external ear normal.     Nose: Nose normal. No congestion or rhinorrhea.     Right Turbinates: Not swollen.     Left Turbinates: Not swollen.     Mouth/Throat:     Mouth: Mucous membranes are moist.     Pharynx: Oropharynx is clear. No oropharyngeal exudate.  Eyes:     General: No scleral icterus.    Conjunctiva/sclera: Conjunctivae normal.     Pupils: Pupils are equal, round, and reactive to light.  Neck:     Thyroid: Thyromegaly (mild) present. No thyroid mass or thyroid tenderness.  Cardiovascular:     Rate and Rhythm: Normal rate and regular rhythm.     Pulses: Normal pulses.     Heart sounds: Normal heart sounds. No murmur heard. Pulmonary:     Effort: Pulmonary effort is normal. No respiratory distress.     Breath sounds: Normal breath sounds. No wheezing or rales.  Abdominal:     General: There is no distension.     Palpations: Abdomen is soft.     Tenderness: There is no abdominal tenderness.  Musculoskeletal:        General: No deformity.     Cervical back: Neck supple. No edema. Normal range of motion.     Right lower leg: No edema.     Left lower leg: No edema.  Lymphadenopathy:     Cervical: No cervical adenopathy.  Skin:    General: Skin is warm and dry.     Findings: No rash.  Neurological:     Mental Status: He is alert and oriented to person, place, and time. Mental status is at baseline.     Gait: Gait normal.  Psychiatric:        Mood and Affect: Mood normal.        Behavior: Behavior normal.        Thought Content: Thought content normal.        Judgment: Judgment normal.     Depression Screen    12/30/2022    8:11 AM  12/17/2022    2:55 PM 12/10/2021    8:42 AM 12/07/2020   10:00 AM  PHQ 2/9 Scores  PHQ - 2 Score 0 0 0 0  PHQ- 9 Score 0  0 0   No results found for any visits on 12/30/22.  Assessment & Plan     1. Annual physical exam Benign physical exam overall.  Will  check routine labs as noted below. - Comprehensive metabolic panel - Lipid panel - PSA Total (Reflex To Free) - TSH Rfx on Abnormal to Free T4  2. Seizure disorder (HCC) Patient's last seizure was in 2015 when he was trialed off his Keppra.  He remains stable at this time.  Will check routine monitoring labs as noted below. - CBC With Differential - Levetiracetam level  3. Family history of malignant neoplasm of prostate - PSA Total (Reflex To Free)   Return in about 1 year (around 12/30/2023).     The entirety of the information documented in the History of Present Illness, Review of Systems and Physical Exam were personally obtained by me. Portions of this information were initially documented by the CMA, Daneen Schick, and reviewed by me for thoroughness and accuracy.   I discussed the assessment and treatment plan with the patient  The patient was provided an opportunity to ask questions and all were answered. The patient agreed with the plan and demonstrated an understanding of the instructions.   The patient was advised to call back or seek an in-person evaluation if the symptoms worsen or if the condition fails to improve as anticipated.    Sherlyn Hay, DO  [facility] [phone] (phone) [phone] (fax)  [facility]

## 2022-12-31 LAB — COMPREHENSIVE METABOLIC PANEL
ALT: 19 IU/L (ref 0–44)
AST: 24 IU/L (ref 0–40)
Albumin: 4.5 g/dL (ref 3.8–4.8)
Alkaline Phosphatase: 62 IU/L (ref 44–121)
BUN/Creatinine Ratio: 21 (ref 10–24)
BUN: 20 mg/dL (ref 8–27)
Bilirubin Total: 0.7 mg/dL (ref 0.0–1.2)
CO2: 24 mmol/L (ref 20–29)
Calcium: 9.5 mg/dL (ref 8.6–10.2)
Chloride: 105 mmol/L (ref 96–106)
Creatinine, Ser: 0.94 mg/dL (ref 0.76–1.27)
Globulin, Total: 1.9 g/dL (ref 1.5–4.5)
Glucose: 96 mg/dL (ref 70–99)
Potassium: 4.7 mmol/L (ref 3.5–5.2)
Sodium: 141 mmol/L (ref 134–144)
Total Protein: 6.4 g/dL (ref 6.0–8.5)
eGFR: 85 mL/min/{1.73_m2} (ref >59)

## 2022-12-31 LAB — CBC WITH DIFFERENTIAL
Basophils Absolute: 0.1 10*3/uL (ref 0.0–0.2)
Basos: 1 %
EOS (ABSOLUTE): 0.2 10*3/uL (ref 0.0–0.4)
Eos: 4 %
Hematocrit: 40.4 % (ref 37.5–51.0)
Hemoglobin: 13.2 g/dL (ref 13.0–17.7)
Immature Grans (Abs): 0 10*3/uL (ref 0.0–0.1)
Immature Granulocytes: 1 %
Lymphocytes Absolute: 1.5 10*3/uL (ref 0.7–3.1)
Lymphs: 29 %
MCH: 29.3 pg (ref 26.6–33.0)
MCHC: 32.7 g/dL (ref 31.5–35.7)
MCV: 90 fL (ref 79–97)
Monocytes Absolute: 0.6 10*3/uL (ref 0.1–0.9)
Monocytes: 11 %
Neutrophils Absolute: 2.8 10*3/uL (ref 1.4–7.0)
Neutrophils: 54 %
RBC: 4.51 x10E6/uL (ref 4.14–5.80)
RDW: 12.6 % (ref 11.6–15.4)
WBC: 5.1 10*3/uL (ref 3.4–10.8)

## 2022-12-31 LAB — LIPID PANEL
Chol/HDL Ratio: 2.7 ratio (ref 0.0–5.0)
Cholesterol, Total: 159 mg/dL (ref 100–199)
HDL: 58 mg/dL (ref >39)
LDL Chol Calc (NIH): 90 mg/dL (ref 0–99)
Triglycerides: 51 mg/dL (ref 0–149)
VLDL Cholesterol Cal: 11 mg/dL (ref 5–40)

## 2022-12-31 LAB — TSH RFX ON ABNORMAL TO FREE T4: TSH: 2.9 u[IU]/mL (ref 0.450–4.500)

## 2022-12-31 LAB — LEVETIRACETAM LEVEL: Levetiracetam Lvl: 15.1 ug/mL (ref 10.0–40.0)

## 2022-12-31 LAB — PSA TOTAL (REFLEX TO FREE): Prostate Specific Ag, Serum: 3.4 ng/mL (ref 0.0–4.0)

## 2023-01-07 ENCOUNTER — Encounter: Payer: Self-pay | Admitting: Family Medicine

## 2023-02-17 ENCOUNTER — Ambulatory Visit: Payer: Medicare HMO | Admitting: Dermatology

## 2023-02-17 ENCOUNTER — Encounter: Payer: Self-pay | Admitting: Dermatology

## 2023-02-17 VITALS — BP 127/77

## 2023-02-17 DIAGNOSIS — L578 Other skin changes due to chronic exposure to nonionizing radiation: Secondary | ICD-10-CM

## 2023-02-17 DIAGNOSIS — W908XXA Exposure to other nonionizing radiation, initial encounter: Secondary | ICD-10-CM

## 2023-02-17 DIAGNOSIS — Z85828 Personal history of other malignant neoplasm of skin: Secondary | ICD-10-CM

## 2023-02-17 DIAGNOSIS — L821 Other seborrheic keratosis: Secondary | ICD-10-CM

## 2023-02-17 DIAGNOSIS — L814 Other melanin hyperpigmentation: Secondary | ICD-10-CM

## 2023-02-17 DIAGNOSIS — D1801 Hemangioma of skin and subcutaneous tissue: Secondary | ICD-10-CM

## 2023-02-17 DIAGNOSIS — D229 Melanocytic nevi, unspecified: Secondary | ICD-10-CM

## 2023-02-17 DIAGNOSIS — L820 Inflamed seborrheic keratosis: Secondary | ICD-10-CM

## 2023-02-17 DIAGNOSIS — Z1283 Encounter for screening for malignant neoplasm of skin: Secondary | ICD-10-CM | POA: Diagnosis not present

## 2023-02-17 DIAGNOSIS — L570 Actinic keratosis: Secondary | ICD-10-CM | POA: Diagnosis not present

## 2023-02-17 DIAGNOSIS — I781 Nevus, non-neoplastic: Secondary | ICD-10-CM | POA: Diagnosis not present

## 2023-02-17 NOTE — Patient Instructions (Signed)

## 2023-02-17 NOTE — Progress Notes (Signed)
Follow-Up Visit   Subjective  Scott Potts is a 77 y.o. male who presents for the following: Skin Cancer Screening and Upper Body Skin Exam - History of SCC. He has a spot on his right thigh that has been there for about 3 weeks that is not going away.  It stays scabbed.  The patient presents for Upper Body Skin Exam (UBSE) for skin cancer screening and mole check. The patient has spots, moles and lesions to be evaluated, some may be new or changing and the patient may have concern these could be cancer.    The following portions of the chart were reviewed this encounter and updated as appropriate: medications, allergies, medical history  Review of Systems:  No other skin or systemic complaints except as noted in HPI or Assessment and Plan.  Objective  Well appearing patient in no apparent distress; mood and affect are within normal limits.  All skin waist up examined. Relevant physical exam findings are noted in the Assessment and Plan.  Right postauricular neck x 4, left forearm below elbow x 2, left post neck x 1, (7) Erythematous thin papules/macules with gritty scale.   Right post thigh Erythematous stuck-on, waxy papule    Assessment & Plan   AK (actinic keratosis) (7) Right postauricular neck x 4, left forearm below elbow x 2, left post neck x 1,  May consider Fluorouracil 5%/Calcipotriene cream to right postauricular neck if not improved of f/up.  Destruction of lesion - Right postauricular neck x 4, left forearm below elbow x 2, left post neck x 1, (7)  Destruction method: cryotherapy   Informed consent: discussed and consent obtained   Lesion destroyed using liquid nitrogen: Yes   Region frozen until ice ball extended beyond lesion: Yes   Outcome: patient tolerated procedure well with no complications   Post-procedure details: wound care instructions given   Additional details:  Prior to procedure, discussed risks of blister formation, small wound, skin  dyspigmentation, or rare scar following cryotherapy. Recommend Vaseline ointment to treated areas while healing.   Inflamed seborrheic keratosis Right post thigh  Symptomatic, irritating, patient would like treated.  Benign-appearing.  Call clinic for new or changing lesions.   RTC if persistent  Destruction of lesion - Right post thigh  Destruction method: cryotherapy   Informed consent: discussed and consent obtained   Lesion destroyed using liquid nitrogen: Yes   Region frozen until ice ball extended beyond lesion: Yes   Outcome: patient tolerated procedure well with no complications   Post-procedure details: wound care instructions given   Additional details:  Prior to procedure, discussed risks of blister formation, small wound, skin dyspigmentation, or rare scar following cryotherapy. Recommend Vaseline ointment to treated areas while healing.    Skin cancer screening performed today.  Actinic Damage - Chronic condition, secondary to cumulative UV/sun exposure - diffuse scaly erythematous macules with underlying dyspigmentation - Recommend daily broad spectrum sunscreen SPF 30+ to sun-exposed areas, reapply every 2 hours as needed.  - Staying in the shade or wearing long sleeves, sun glasses (UVA+UVB protection) and wide brim hats (4-inch brim around the entire circumference of the hat) are also recommended for sun protection.  - Call for new or changing lesions.  Lentigines, Seborrheic Keratoses, Hemangiomas - Benign normal skin lesions - Benign-appearing - Call for any changes  Melanocytic Nevi - Tan-brown and/or pink-flesh-colored symmetric macules and papules - Benign appearing on exam today - Observation - Call clinic for new or changing moles -  Recommend daily use of broad spectrum spf 30+ sunscreen to sun-exposed areas.   HISTORY OF SQUAMOUS CELL CARCINOMA OF THE SKIN - No evidence of recurrence today - Recommend regular full body skin exams - Recommend daily  broad spectrum sunscreen SPF 30+ to sun-exposed areas, reapply every 2 hours as needed.  - Call if any new or changing lesions are noted between office visits  TELANGIECTASIA Exam: Blanching red macule of nasal dorsum  Treatment Plan: Benign-appearing.  Observation.  Call clinic for new or changing lesions.  Recommend daily use of broad spectrum spf 30+ sunscreen to sun-exposed areas.      Return for Follow up as scheduled.  I, Joanie Coddington, CMA, am acting as scribe for Willeen Niece, MD .   Documentation: I have reviewed the above documentation for accuracy and completeness, and I agree with the above.  Willeen Niece, MD

## 2023-03-04 ENCOUNTER — Ambulatory Visit: Payer: Medicare HMO | Admitting: Family Medicine

## 2023-05-12 DIAGNOSIS — H524 Presbyopia: Secondary | ICD-10-CM | POA: Diagnosis not present

## 2023-07-28 ENCOUNTER — Ambulatory Visit: Payer: Medicare Other | Admitting: Dermatology

## 2023-07-28 ENCOUNTER — Encounter: Payer: Self-pay | Admitting: Dermatology

## 2023-07-28 DIAGNOSIS — D1801 Hemangioma of skin and subcutaneous tissue: Secondary | ICD-10-CM

## 2023-07-28 DIAGNOSIS — D229 Melanocytic nevi, unspecified: Secondary | ICD-10-CM

## 2023-07-28 DIAGNOSIS — Z85828 Personal history of other malignant neoplasm of skin: Secondary | ICD-10-CM

## 2023-07-28 DIAGNOSIS — L570 Actinic keratosis: Secondary | ICD-10-CM

## 2023-07-28 DIAGNOSIS — Z1283 Encounter for screening for malignant neoplasm of skin: Secondary | ICD-10-CM | POA: Diagnosis not present

## 2023-07-28 DIAGNOSIS — L821 Other seborrheic keratosis: Secondary | ICD-10-CM

## 2023-07-28 DIAGNOSIS — L814 Other melanin hyperpigmentation: Secondary | ICD-10-CM | POA: Diagnosis not present

## 2023-07-28 DIAGNOSIS — W908XXA Exposure to other nonionizing radiation, initial encounter: Secondary | ICD-10-CM | POA: Diagnosis not present

## 2023-07-28 DIAGNOSIS — L578 Other skin changes due to chronic exposure to nonionizing radiation: Secondary | ICD-10-CM | POA: Diagnosis not present

## 2023-07-28 NOTE — Patient Instructions (Addendum)

## 2023-07-28 NOTE — Progress Notes (Signed)
Follow-Up Visit   Subjective  Scott Potts is a 78 y.o. male who presents for the following: Skin Cancer Screening and Upper Body Skin Exam hx of SCC, AKs  The patient presents for Upper Body Skin Exam (UBSE) for skin cancer screening and mole check. The patient has spots, moles and lesions to be evaluated, some may be new or changing and the patient may have concern these could be cancer.    The following portions of the chart were reviewed this encounter and updated as appropriate: medications, allergies, medical history  Review of Systems:  No other skin or systemic complaints except as noted in HPI or Assessment and Plan.  Objective  Well appearing patient in no apparent distress; mood and affect are within normal limits.  All skin waist up and legs examined. Relevant physical exam findings are noted in the Assessment and Plan.  R forearm x 2, R ear helix x 1 (3) Pink scaly macules  Assessment & Plan   AK (ACTINIC KERATOSIS) (3) R forearm x 2, R ear helix x 1 (3) Actinic keratoses are precancerous spots that appear secondary to cumulative UV radiation exposure/sun exposure over time. They are chronic with expected duration over 1 year. A portion of actinic keratoses will progress to squamous cell carcinoma of the skin. It is not possible to reliably predict which spots will progress to skin cancer and so treatment is recommended to prevent development of skin cancer.  Recommend daily broad spectrum sunscreen SPF 30+ to sun-exposed areas, reapply every 2 hours as needed.  Recommend staying in the shade or wearing long sleeves, sun glasses (UVA+UVB protection) and wide brim hats (4-inch brim around the entire circumference of the hat). Call for new or changing lesions. Destruction of lesion - R forearm x 2, R ear helix x 1 (3)  Destruction method: cryotherapy   Informed consent: discussed and consent obtained   Lesion destroyed using liquid nitrogen: Yes   Region frozen  until ice ball extended beyond lesion: Yes   Outcome: patient tolerated procedure well with no complications   Post-procedure details: wound care instructions given   Additional details:  Prior to procedure, discussed risks of blister formation, small wound, skin dyspigmentation, or rare scar following cryotherapy. Recommend Vaseline ointment to treated areas while healing.   Skin cancer screening performed today.  Actinic Damage - Chronic condition, secondary to cumulative UV/sun exposure - diffuse scaly erythematous macules with underlying dyspigmentation - Recommend daily broad spectrum sunscreen SPF 30+ to sun-exposed areas, reapply every 2 hours as needed.  - Staying in the shade or wearing long sleeves, sun glasses (UVA+UVB protection) and wide brim hats (4-inch brim around the entire circumference of the hat) are also recommended for sun protection.  - Call for new or changing lesions.  Lentigines, Seborrheic Keratoses, Hemangiomas - Benign normal skin lesions - Benign-appearing - Call for any changes - trunk, legs  Melanocytic Nevi - Tan-brown and/or pink-flesh-colored symmetric macules and papules - Benign appearing on exam today - Observation - Call clinic for new or changing moles - Recommend daily use of broad spectrum spf 30+ sunscreen to sun-exposed areas.   HISTORY OF SQUAMOUS CELL CARCINOMA OF THE SKIN - No evidence of recurrence today -R neck, L mid back - Recommend regular full body skin exams - Recommend daily broad spectrum sunscreen SPF 30+ to sun-exposed areas, reapply every 2 hours as needed.  - Call if any new or changing lesions are noted between office visits   Return  in about 1 year (around 07/27/2024) for UBSE, Hx of SCC, Hx of AKs.  I, Ardis Rowan, RMA, am acting as scribe for Willeen Niece, MD .   Documentation: I have reviewed the above documentation for accuracy and completeness, and I agree with the above.  Willeen Niece, MD

## 2023-09-03 DIAGNOSIS — R430 Anosmia: Secondary | ICD-10-CM | POA: Diagnosis not present

## 2023-09-03 DIAGNOSIS — G40909 Epilepsy, unspecified, not intractable, without status epilepticus: Secondary | ICD-10-CM | POA: Diagnosis not present

## 2023-12-24 IMAGING — DX DG HAND COMPLETE 3+V*R*
3 series · 3 of 3 positions shown · non-contrast
Comparison: None.

CLINICAL DATA: Laceration.  Punctured by a steel rod pipe.

EXAM:
RIGHT HAND - COMPLETE 3+ VIEW

[hand ap]
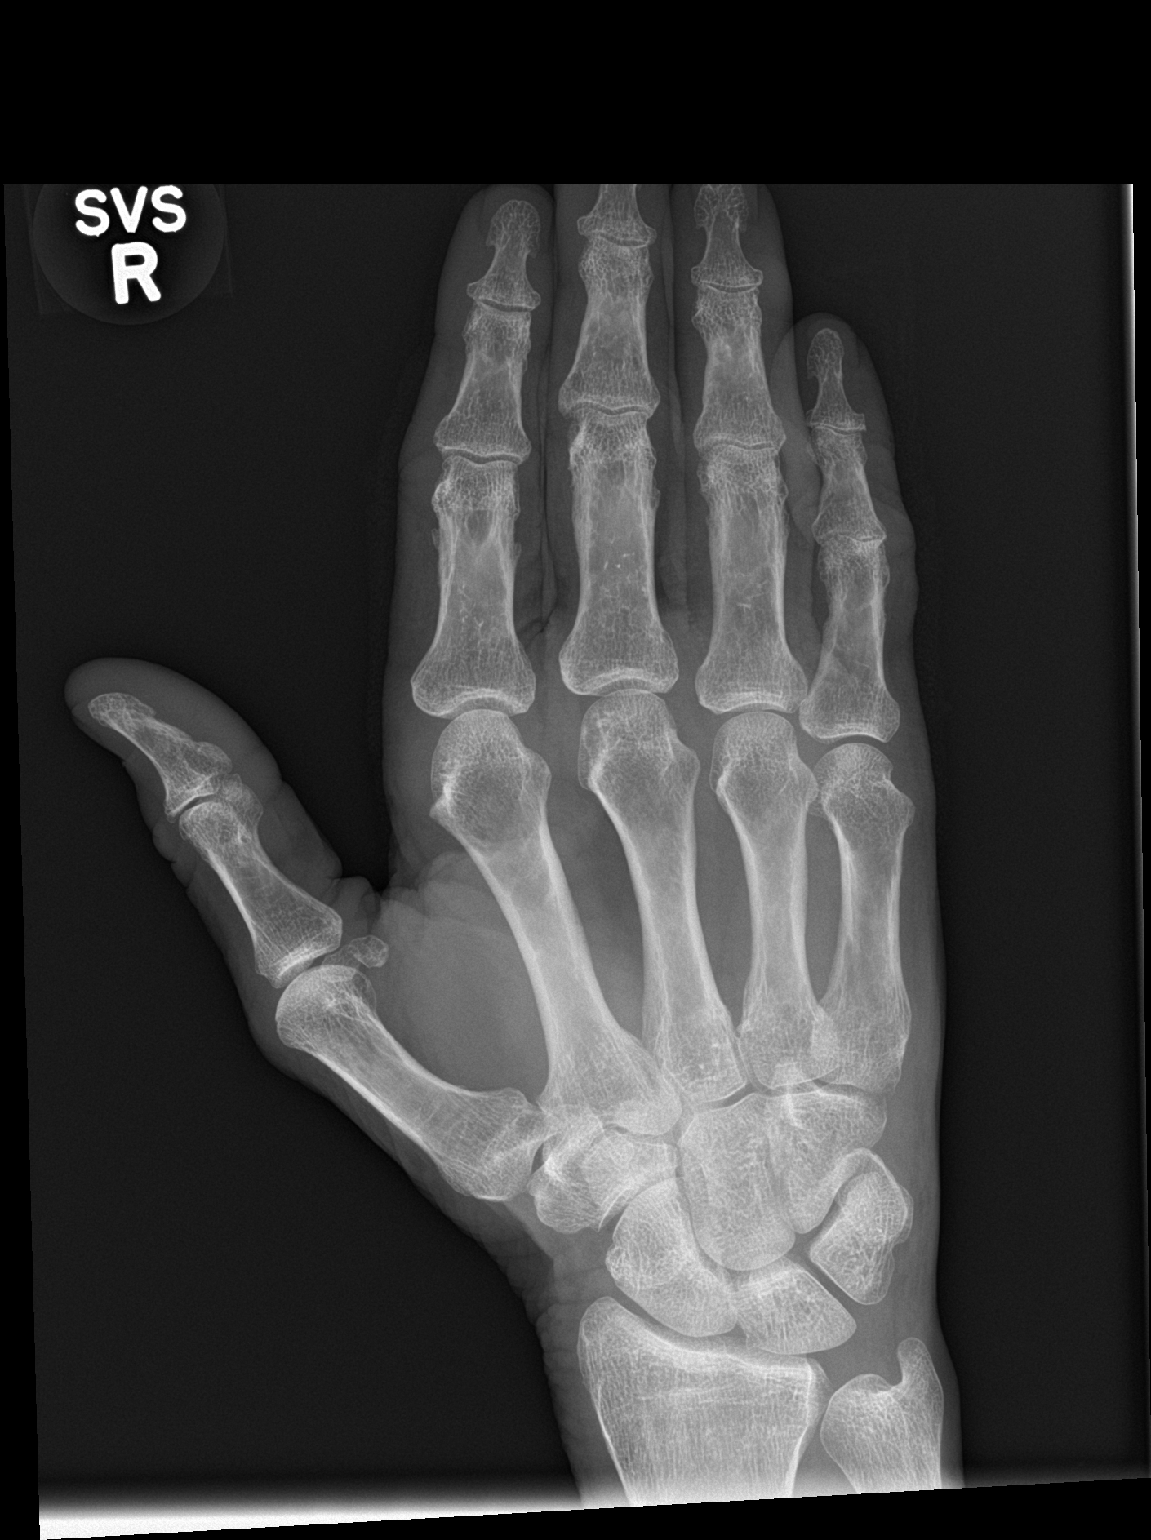

[hand obl]
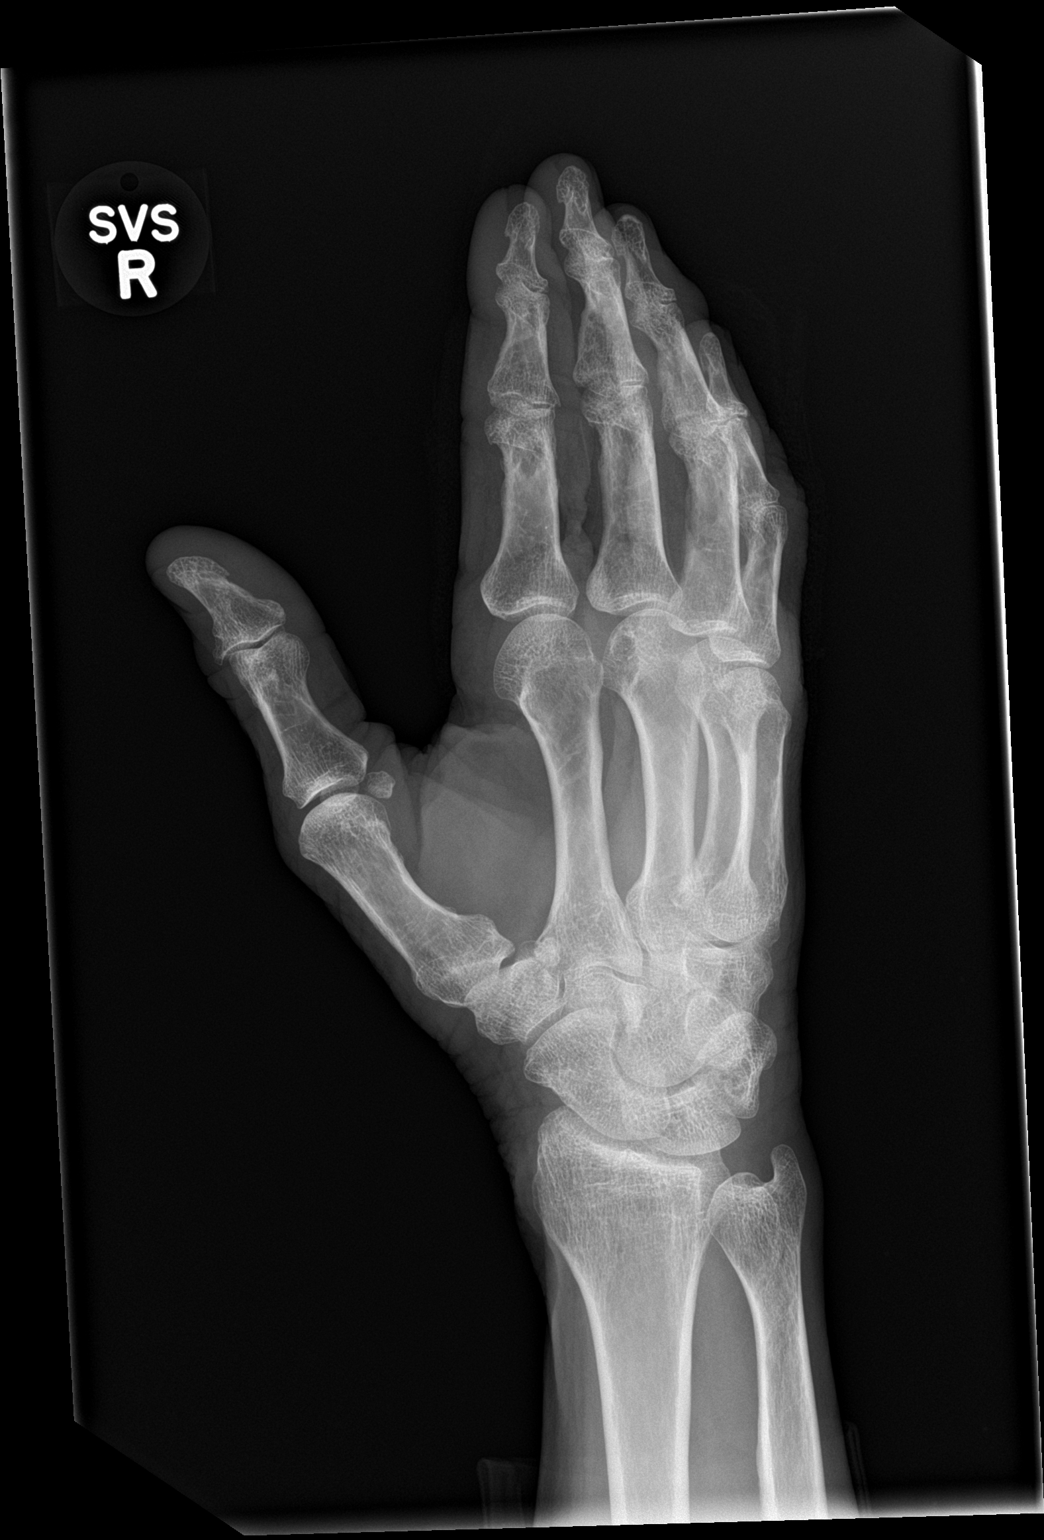

[hand lat]
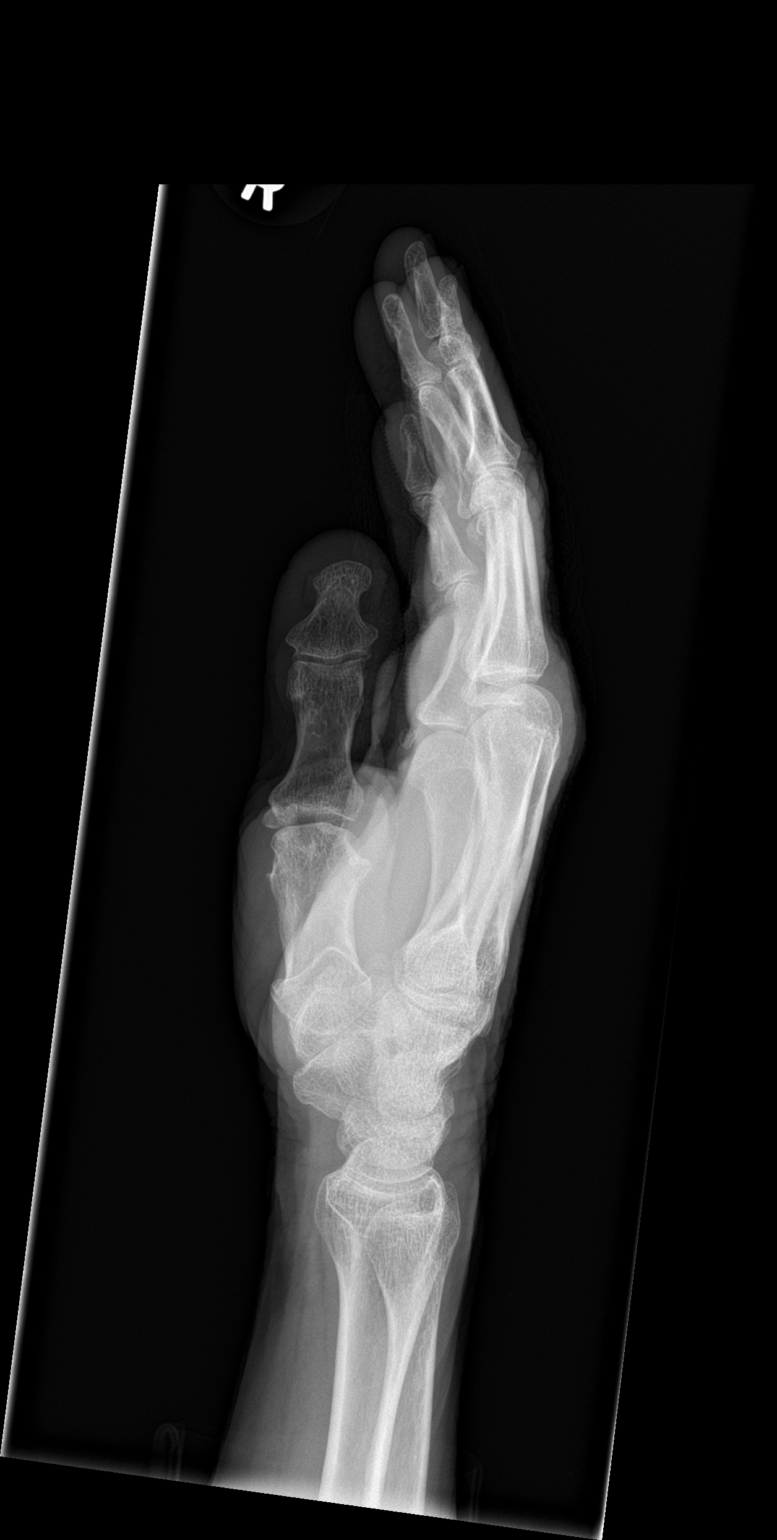

[3 of 3 positions shown; findings below may reference images not displayed]

FINDINGS: There is no evidence of fracture or dislocation. Lateral view of the
digits is limited due to osseous overlap. Minimal osteoarthritis of
the digits. No erosion or periosteal reaction. Visualized soft
tissue gas or radiopaque foreign body.
IMPRESSION: 1. No acute fracture or dislocation.
2. No radiopaque foreign body or soft tissue gas.

## 2024-01-01 ENCOUNTER — Encounter: Payer: Self-pay | Admitting: Family Medicine

## 2024-01-01 ENCOUNTER — Ambulatory Visit
Admission: RE | Admit: 2024-01-01 | Discharge: 2024-01-01 | Disposition: A | Discharge status: Home / Self Care | Source: Ambulatory Visit | Attending: Family Medicine | Admitting: Family Medicine

## 2024-01-01 ENCOUNTER — Ambulatory Visit (INDEPENDENT_AMBULATORY_CARE_PROVIDER_SITE_OTHER): Payer: Self-pay | Admitting: Family Medicine

## 2024-01-01 VITALS — BP 122/84 | HR 76 | Resp 16 | Ht 73.0 in | Wt 176.1 lb

## 2024-01-01 DIAGNOSIS — N182 Chronic kidney disease, stage 2 (mild): Secondary | ICD-10-CM | POA: Diagnosis not present

## 2024-01-01 DIAGNOSIS — Z0000 Encounter for general adult medical examination without abnormal findings: Secondary | ICD-10-CM

## 2024-01-01 DIAGNOSIS — M19042 Primary osteoarthritis, left hand: Secondary | ICD-10-CM

## 2024-01-01 DIAGNOSIS — Z136 Encounter for screening for cardiovascular disorders: Secondary | ICD-10-CM

## 2024-01-01 DIAGNOSIS — G40909 Epilepsy, unspecified, not intractable, without status epilepticus: Secondary | ICD-10-CM

## 2024-01-01 DIAGNOSIS — R590 Localized enlarged lymph nodes: Secondary | ICD-10-CM

## 2024-01-01 DIAGNOSIS — R221 Localized swelling, mass and lump, neck: Secondary | ICD-10-CM | POA: Diagnosis not present

## 2024-01-01 DIAGNOSIS — M19041 Primary osteoarthritis, right hand: Secondary | ICD-10-CM

## 2024-01-01 DIAGNOSIS — H9313 Tinnitus, bilateral: Secondary | ICD-10-CM

## 2024-01-01 DIAGNOSIS — Z79899 Other long term (current) drug therapy: Secondary | ICD-10-CM

## 2024-01-01 DIAGNOSIS — Z125 Encounter for screening for malignant neoplasm of prostate: Secondary | ICD-10-CM | POA: Diagnosis not present

## 2024-01-01 DIAGNOSIS — R438 Other disturbances of smell and taste: Secondary | ICD-10-CM

## 2024-01-01 NOTE — Progress Notes (Signed)
 Complete physical exam   Patient: Scott Potts   DOB: August 02, 1945   78 y.o. Male  MRN: [id number] Visit Date: 01/01/2024  Today's healthcare provider: LAURAINE LOISE BUOY, DO   Chief Complaint  Patient presents with   Annual Exam    Patient doing well today. He is exercising. He is sleeping well.   Subjective    Scott Potts is a 78 y.o. male who presents today for a complete physical exam.    HPI HPI     Annual Exam    Additional comments: Patient doing well today. He is exercising. He is sleeping well.      Last edited by Rosas, Joseline E, CMA on 01/01/2024  8:07 AM.      Scott Potts is a 78 year old male who presents for an annual physical exam.  He has no significant changes in his health since his last visit and has no current concerns. He has a history of seizures and follows up with his neurologist for management. He is unsure if his Keppra  levels are regularly checked.  There is a family history of prostate cancer, with two brothers affected. He prefers annual prostate-specific antigen screening, especially after attending a funeral for a friend who died of prostate cancer, which has heightened his awareness of the condition.  He denies issues with cholesterol or thyroid  problems. He had one elevated A1c in the past, but subsequent tests have been normal, and he does not believe he is diabetic. He reports improved dietary habits over the past year, avoiding sugary foods and fast food.  He has arthritis in his fingers and uses Afrin occasionally for nasal congestion, ensuring not to use it more than three days in a row.  He experiences a loss of sense of smell annually, unrelated to COVID-19, and has tinnitus, which he feels is untreatable. He walks five miles a day, five times a week, for exercise.  He recalls having surgery in 2009 for a leaking valve, performed by Dr. Sudie Laine, and notes a quick recovery post-surgery.    Past Medical History:   Diagnosis Date   Actinic keratosis    Arthritis    History of kidney stones    History of nephrolithiasis    History of vein stripping    Mitral valve regurgitation    Seizure (HCC)    6 months ago while sleeping   Squamous cell carcinoma of skin 06/02/2018   right neck, superficially invasive well differentiated   Squamous cell carcinoma of skin 07/22/2022   left mid back, EDC done  08/19/22   Thrombophlebitis of lower extremities    left   Past Surgical History:  Procedure Laterality Date   CARDIAC CATHETERIZATION  01/05/2008   COLONOSCOPY  07/09/2003   endocardiogram  07/09/2007   heart valve repair  07/09/2007   SPERMATOCELECTOMY Left 07/27/2019   Procedure: SPERMATOCELECTOMY;  Surgeon: Twylla Glendia BROCKS, MD;  Location: [facility] ORS;  Service: Urology;  Laterality: Left;   VARICOSE VEIN SURGERY     vein strip left leg   Social History   Socioeconomic History   Marital status: Married    Spouse name: Donzell   Number of children: 2   Years of education: Not on file   Highest education level: Bachelor's degree (e.g., BA, AB, BS)  Occupational History   Occupation: Retired    Associate Professor: [employer]  Tobacco Use   Smoking status: Former   Smokeless tobacco: Never   Tobacco  comments:    quit 2009- 1-2 a day  Vaping Use   Vaping status: Never Used  Substance and Sexual Activity   Alcohol use: Not Currently    Alcohol/week: 1.0 standard drink of alcohol    Types: 1 Cans of beer per week    Comment: less than 2 beers per month   Drug use: No   Sexual activity: Not Currently    Birth control/protection: None  Other Topics Concern   Not on file  Social History Narrative   Pt lives alone. Moderate amount of caffeine daily. Regular exercise.   Social Drivers of Corporate investment banker Strain: Low Risk  (12/25/2023)   Overall Financial Resource Strain (CARDIA)    Difficulty of Paying Living Expenses: Not hard at all  Food Insecurity: No Food Insecurity (12/25/2023)    Hunger Vital Sign    Worried About Running Out of Food in the Last Year: Never true    Ran Out of Food in the Last Year: Never true  Transportation Needs: No Transportation Needs (12/25/2023)   PRAPARE - Administrator, Civil Service (Medical): No    Lack of Transportation (Non-Medical): No  Physical Activity: Sufficiently Active (12/25/2023)   Exercise Vital Sign    Days of Exercise per Week: 7 days    Minutes of Exercise per Session: 90 min  Stress: No Stress Concern Present (12/13/2022)   [employer] of Occupational Health - Occupational Stress Questionnaire    Feeling of Stress : Not at all  Social Connections: Moderately Integrated (12/25/2023)   Social Connection and Isolation Panel    Frequency of Communication with Friends and Family: Once a week    Frequency of Social Gatherings with Friends and Family: More than three times a week    Attends Religious Services: Never    Database administrator or Organizations: Yes    Attends Engineer, structural: More than 4 times per year    Marital Status: Married  Catering manager Violence: Not At Risk (07/29/2019)   Humiliation, Afraid, Rape, and Kick questionnaire    Fear of Current or Ex-Partner: No    Emotionally Abused: No    Physically Abused: No    Sexually Abused: No   Family Status  Relation Name Status   Mother  Alive   Father  Deceased   Sister  Alive   Brother  Alive   Brother  Alive   Sister Callin Ashe (Not Specified)   Neg Hx  (Not Specified)  No partnership data on file   Family History  Problem Relation Age of Onset   Prostate cancer Brother    Prostate cancer Brother    Early death Sister    Colon cancer Neg Hx    Allergies  Allergen Reactions   Penicillins Other (See Comments)    Unknown reaction, childhood reaction Did it involve swelling of the face/tongue/throat, SOB, or low BP? Unknown Did it involve sudden or severe rash/hives, skin peeling, or any reaction on  the inside of your mouth or nose? Unknown Did you need to seek medical attention at a hospital or doctor's office? Unknown When did it last happen?childhood reaction.   If all above answers are "NO", may proceed with cephalosporin use.     Patient Care Team: Donzella Lauraine SAILOR, DO as PCP - General (Family Medicine) Maree Jannett POUR, MD as Consulting Physician (Neurology) Twylla Glendia BROCKS, MD (Urology)   Medications: Outpatient Medications Prior to Visit  Medication  Sig   levETIRAcetam  (KEPPRA ) 500 MG tablet Take 500 mg by mouth 2 (two) times daily.    Multiple Vitamin (MULTIVITAMIN WITH MINERALS) TABS tablet Take 1 tablet by mouth daily.   Omega-3 Fatty Acids (RA FISH OIL) 1000 MG CAPS Take 1,000 mg by mouth 2 (two) times daily.    oxymetazoline (AFRIN) 0.05 % nasal spray Place 1 spray into both nostrils 2 (two) times daily. Uses as needed   Plant Sterol Stanol-Pantethine (CHOLEST OFF COMPLETE PO)    No facility-administered medications prior to visit.    Review of Systems  Constitutional:  Negative for appetite change, chills, fatigue and fever.  HENT:  Positive for tinnitus. Negative for congestion, ear pain, hearing loss, nosebleeds and trouble swallowing.   Eyes:  Negative for pain and visual disturbance.  Respiratory:  Negative for cough, chest tightness and shortness of breath.   Cardiovascular:  Negative for chest pain, palpitations and leg swelling.  Gastrointestinal:  Negative for abdominal pain, blood in stool, constipation, diarrhea, nausea and vomiting.  Endocrine: Negative for polydipsia, polyphagia and polyuria.  Genitourinary:  Negative for dysuria and flank pain.  Musculoskeletal:  Negative for arthralgias, back pain, joint swelling, myalgias and neck stiffness.  Skin:  Negative for color change, rash and wound.  Neurological:  Negative for dizziness, tremors, seizures, speech difficulty, weakness, light-headedness and headaches.  Psychiatric/Behavioral:  Negative for  behavioral problems, confusion, decreased concentration, dysphoric mood and sleep disturbance. The patient is not nervous/anxious.   All other systems reviewed and are negative.     Objective    BP 122/84 (BP Location: Left Arm, Patient Position: Sitting, Cuff Size: Normal)   Pulse 76   Resp 16   Ht 6' 1 (1.854 m)   Wt 176 lb 1.6 oz (79.9 kg)   SpO2 99%   BMI 23.23 kg/m    Physical Exam Vitals and nursing note reviewed.  Constitutional:      General: He is awake.     Appearance: Normal appearance.  HENT:     Head: Normocephalic and atraumatic.     Right Ear: Ear canal and external ear normal.     Left Ear: Tympanic membrane, ear canal and external ear normal.     Ears:     Comments: Unable to visualize right TM due to cerumen (non-impacted)    Nose: Nose normal.     Mouth/Throat:     Mouth: Mucous membranes are moist.     Pharynx: Oropharynx is clear. No oropharyngeal exudate or posterior oropharyngeal erythema.   Eyes:     General: No scleral icterus.    Extraocular Movements: Extraocular movements intact.     Conjunctiva/sclera: Conjunctivae normal.     Pupils: Pupils are equal, round, and reactive to light.   Neck:     Thyroid : No thyromegaly or thyroid  tenderness.     Vascular: No carotid bruit.    Cardiovascular:     Rate and Rhythm: Normal rate and regular rhythm.     Pulses: Normal pulses.     Heart sounds: Normal heart sounds.  Pulmonary:     Effort: Pulmonary effort is normal. No tachypnea, bradypnea or respiratory distress.     Breath sounds: Normal breath sounds. No stridor. No wheezing, rhonchi or rales.  Abdominal:     General: Bowel sounds are normal. There is no distension.     Palpations: Abdomen is soft. There is no mass.     Tenderness: There is no abdominal tenderness. There is no guarding.  Hernia: No hernia is present.   Musculoskeletal:     Cervical back: Normal range of motion and neck supple.     Right lower leg: No edema.      Left lower leg: No edema.  Lymphadenopathy:     Cervical: Cervical adenopathy (approx. 2 cm by 1 cm on left as noted) present.   Skin:    General: Skin is warm and dry.   Neurological:     Mental Status: He is alert and oriented to person, place, and time. Mental status is at baseline.   Psychiatric:        Mood and Affect: Mood normal.        Behavior: Behavior normal.      Last depression screening scores    01/01/2024    8:11 AM 12/30/2022    8:11 AM 12/17/2022    2:55 PM  PHQ 2/9 Scores  PHQ - 2 Score 0 0 0  PHQ- 9 Score  0    Last fall risk screening    01/01/2024    8:10 AM  Fall Risk   Falls in the past year? 0  Number falls in past yr: 0  Injury with Fall? 0  Risk for fall due to : No Fall Risks   Last Audit-C alcohol use screening    12/25/2023    9:20 AM  Alcohol Use Disorder Test (AUDIT)  1. How often do you have a drink containing alcohol? 1  2. How many drinks containing alcohol do you have on a typical day when you are drinking? 0  3. How often do you have six or more drinks on one occasion? 0  AUDIT-C Score 1   A score of 3 or more in women, and 4 or more in men indicates increased risk for alcohol abuse, EXCEPT if all of the points are from question 1   No results found for any visits on 01/01/24.  Assessment & Plan    Routine Health Maintenance and Physical Exam  Exercise Activities and Dietary recommendations  Goals      DIET - INCREASE WATER INTAKE     Recommend increasing water intake to 4-6 glasses of water a day.         Immunization History  Administered Date(s) Administered   Influenza, High Dose Seasonal PF 03/31/2014, 06/19/2015, 06/17/2016, 06/23/2017, 06/25/2018   Influenza,inj,Quad PF,6+ Mos 08/23/2019   Influenza-Unspecified 04/12/2020   PFIZER(Purple Top)SARS-COV-2 Vaccination 10/19/2019, 11/09/2019, 06/14/2020   Pneumococcal Conjugate-13 12/13/2013   Pneumococcal Polysaccharide-23 12/09/2012   Tdap 11/05/2005,  11/25/2008, 08/25/2021    Health Maintenance  Topic Date Due   Medicare Annual Wellness (AWV)  12/17/2023   INFLUENZA VACCINE  02/06/2024   DTaP/Tdap/Td (4 - Td or Tdap) 08/26/2031   Pneumococcal Vaccine: 50+ Years  Completed   Hepatitis C Screening  Completed   Hepatitis B Vaccines  Aged Out   HPV VACCINES  Aged Out   Meningococcal B Vaccine  Aged Out   Colonoscopy  Discontinued   COVID-19 Vaccine  Discontinued   Zoster Vaccines- Shingrix  Discontinued    Discussed health benefits of physical activity, and encouraged him to engage in regular exercise appropriate for his age and condition.   Annual physical exam  Left cervical lymphadenopathy -     US  SOFT TISSUE HEAD & NECK (NON-THYROID ); Future  Seizure disorder (HCC) -     Levetiracetam  level  High risk medication use -     Levetiracetam  level  Chronic kidney disease, stage  2, mildly decreased GFR -     Microalbumin / creatinine urine ratio -     Comprehensive metabolic panel with GFR  Osteoarthritis of both hands, unspecified osteoarthritis type  Tinnitus of both ears  Hyposmia  Prostate cancer screening -     PSA Total (Reflex To Free)  Encounter for screening for cardiovascular disorders -     Lipid panel     Annual Physical Examination Routine exam with no new concerns. Neurology follows epilepsy. No significant health changes. Regular physical activity maintained. No cholesterol or thyroid  issues. A1c testing not needed. - Perform routine annual physical examination. - Order lab work including Keppra  levels. - Order prostate-specific antigen (PSA) test. - Encourage continued regular physical activity.  Left cervical lymphadenopathy Lymphadenopathy noted to upper anterior cervical chain.  Ultrasound ordered for further evaluation as noted  Seizure disorder Managed by neurology, no recent seizures.  Last Keppra  level, done in June 2024, was normal. - Order Keppra  level test. - Defer to specialist  management  Chronic kidney disease stage 2 Continue to optimize risk factors.  No acute concerns.  Continue to monitor.   Prostate Cancer Screening Prefers annual PSA screening due to personal preference and family history. No symptoms. - Order prostate-specific antigen (PSA) test.  Osteoarthritis Chronic arthritis in fingers, stable.  Tinnitus Chronic tinnitus.  No carotid bruits noted.  No acute concerns.  Continue to monitor.   Hyposmia Loss of smell, thought by patient to be unrelated to COVID-19. No acute concerns.  Continue to monitor.     Return in about 1 year (around 12/31/2024) for CPE.     I discussed the assessment and treatment plan with the patient  The patient was provided an opportunity to ask questions and all were answered. The patient agreed with the plan and demonstrated an understanding of the instructions.   The patient was advised to call back or seek an in-person evaluation if the symptoms worsen or if the condition fails to improve as anticipated.    LAURAINE LOISE BUOY, DO  [facility] [phone] (phone) [phone] (fax)  [facility]

## 2024-01-02 LAB — MICROALBUMIN / CREATININE URINE RATIO
Creatinine, Urine: 233.3 mg/dL
Microalb/Creat Ratio: 7 mg/g{creat} (ref 0–29)
Microalbumin, Urine: 16.8 ug/mL

## 2024-01-02 LAB — LIPID PANEL
Chol/HDL Ratio: 2.8 ratio (ref 0.0–5.0)
Cholesterol, Total: 153 mg/dL (ref 100–199)
HDL: 55 mg/dL (ref >39)
LDL Chol Calc (NIH): 86 mg/dL (ref 0–99)
Triglycerides: 57 mg/dL (ref 0–149)
VLDL Cholesterol Cal: 12 mg/dL (ref 5–40)

## 2024-01-02 LAB — PSA TOTAL (REFLEX TO FREE): Prostate Specific Ag, Serum: 3.9 ng/mL (ref 0.0–4.0)

## 2024-01-02 LAB — COMPREHENSIVE METABOLIC PANEL WITH GFR
ALT: 20 IU/L (ref 0–44)
AST: 24 IU/L (ref 0–40)
Albumin: 4.5 g/dL (ref 3.8–4.8)
Alkaline Phosphatase: 63 IU/L (ref 44–121)
BUN/Creatinine Ratio: 26 — ABNORMAL HIGH (ref 10–24)
BUN: 26 mg/dL (ref 8–27)
Bilirubin Total: 0.9 mg/dL (ref 0.0–1.2)
CO2: 22 mmol/L (ref 20–29)
Calcium: 9.8 mg/dL (ref 8.6–10.2)
Chloride: 100 mmol/L (ref 96–106)
Creatinine, Ser: 1.01 mg/dL (ref 0.76–1.27)
Globulin, Total: 2.2 g/dL (ref 1.5–4.5)
Glucose: 94 mg/dL (ref 70–99)
Potassium: 4.7 mmol/L (ref 3.5–5.2)
Sodium: 139 mmol/L (ref 134–144)
Total Protein: 6.7 g/dL (ref 6.0–8.5)
eGFR: 77 mL/min/{1.73_m2} (ref >59)

## 2024-01-02 LAB — LEVETIRACETAM LEVEL: Levetiracetam Lvl: 17.6 ug/mL (ref 10.0–40.0)

## 2024-01-05 ENCOUNTER — Ambulatory Visit: Payer: Self-pay | Admitting: Family Medicine

## 2024-01-05 DIAGNOSIS — R590 Localized enlarged lymph nodes: Secondary | ICD-10-CM

## 2024-01-08 ENCOUNTER — Telehealth: Payer: Self-pay

## 2024-01-08 NOTE — Telephone Encounter (Signed)
 Patient came into clinic, wanting to know more information regarding his ultrasound results. Brought pt and wife back, made aware tissue nodule with dystrophic calcifications, when calcifications are found we like to go for further imagining such as CT scan. Provider has placed the order and someone with imaging will give you a call to schedule. Wife and pt verbalized understanding, made aware due to running into holidays someone may contact them this week to schedule if not it would possibly be next week, both agreed of understanding.

## 2024-01-13 ENCOUNTER — Encounter: Payer: Self-pay | Admitting: Family Medicine

## 2024-01-13 ENCOUNTER — Telehealth: Payer: Self-pay | Admitting: Family Medicine

## 2024-01-13 NOTE — Telephone Encounter (Signed)
 Copied from CRM [id number]. Topic: Referral - Prior Authorization Question >> Jan 13, 2024  9:54 AM Donee H wrote: Reason for CRM: Consuelo from [employer] Called wanting to know if there has been a prior authorization for patient's Ct Soft Tissue Neck W Contrast. She states patient is scheduled to have test done tomorrow afternoon. Need information by 12 today or will have to cancel patient's appointment. Please follow back up with her at  [phone]

## 2024-01-13 NOTE — Telephone Encounter (Signed)
 Received a fax from [facility] for approval for CT scan of soft tissue of neck with contrast.  Auth# [id number].  I have called Consuelo at [facility] and advised her of this information.  Document in patient's chart under media.

## 2024-01-14 ENCOUNTER — Ambulatory Visit
Admission: RE | Admit: 2024-01-14 | Discharge: 2024-01-14 | Disposition: A | Discharge status: Home / Self Care | Source: Ambulatory Visit | Attending: Family Medicine | Admitting: Family Medicine

## 2024-01-14 DIAGNOSIS — I6523 Occlusion and stenosis of bilateral carotid arteries: Secondary | ICD-10-CM | POA: Diagnosis not present

## 2024-01-14 DIAGNOSIS — R590 Localized enlarged lymph nodes: Secondary | ICD-10-CM

## 2024-01-14 DIAGNOSIS — M47812 Spondylosis without myelopathy or radiculopathy, cervical region: Secondary | ICD-10-CM | POA: Diagnosis not present

## 2024-01-14 MED ORDER — IOPAMIDOL (ISOVUE-300) INJECTION 61%
75.0000 mL | Freq: Once | INTRAVENOUS | Status: AC | PRN
  Administered 2024-01-14: 75 mL via INTRAVENOUS

## 2024-01-20 ENCOUNTER — Encounter: Payer: Self-pay | Admitting: Family Medicine

## 2024-01-20 ENCOUNTER — Ambulatory Visit: Payer: Medicare Other

## 2024-01-20 VITALS — BP 126/72 | Ht 73.0 in | Wt 176.0 lb

## 2024-01-20 DIAGNOSIS — Z0000 Encounter for general adult medical examination without abnormal findings: Secondary | ICD-10-CM

## 2024-01-20 NOTE — Progress Notes (Signed)
 Subjective:   Scott Potts is a 78 y.o. who presents for a Medicare Wellness preventive visit.  As a reminder, Annual Wellness Visits don't include a physical exam, and some assessments may be limited, especially if this visit is performed virtually. We may recommend an in-person follow-up visit with your provider if needed.  Visit Complete: In person    Persons Participating in Visit: Patient.  AWV Questionnaire: No: Patient Medicare AWV questionnaire was not completed prior to this visit.  Cardiac Risk Factors include: advanced age (>45men, >60 women);male gender     Objective:    Today's Vitals   01/20/24 1349  BP: 126/72  Weight: 176 lb (79.8 kg)  Height: 6' 1 (1.854 m)  PainSc: 0-No pain   Body mass index is 23.22 kg/m.     01/20/2024    1:56 PM 12/17/2022    2:57 PM 08/25/2021    4:29 PM 07/29/2019    1:23 PM 07/27/2019    6:22 AM 07/21/2019    7:58 AM 06/25/2018    2:55 PM  Advanced Directives  Does Patient Have a Medical Advance Directive? Yes Yes No Yes Yes Yes Yes   Type of Estate agent of [place];Living will Healthcare Power of [place];Living will  Healthcare Power of [place];Living will Healthcare Power of [employer] Power of Attorney Living will  Does patient want to make changes to medical advance directive? No - Patient declined    No - Patient declined No - Patient declined   Copy of Healthcare Power of Attorney in Chart? Yes - validated most recent copy scanned in chart (See row information)   No - copy requested No - copy requested No - copy requested      Data saved with a previous flowsheet row definition    Current Medications (verified) Outpatient Encounter Medications as of 01/20/2024  Medication Sig   levETIRAcetam  (KEPPRA ) 500 MG tablet Take 500 mg by mouth 2 (two) times daily.    Multiple Vitamin (MULTIVITAMIN WITH MINERALS) TABS tablet Take 1 tablet by mouth daily.   Omega-3 Fatty Acids (RA FISH OIL) 1000  MG CAPS Take 1,000 mg by mouth 2 (two) times daily.    oxymetazoline (AFRIN) 0.05 % nasal spray Place 1 spray into both nostrils 2 (two) times daily. Uses as needed   Plant Sterol Stanol-Pantethine (CHOLEST OFF COMPLETE PO)    No facility-administered encounter medications on file as of 01/20/2024.    Allergies (verified) Penicillins   History: Past Medical History:  Diagnosis Date   Actinic keratosis    Arthritis    History of kidney stones    History of nephrolithiasis    History of vein stripping    Mitral valve regurgitation    Seizure (HCC)    6 months ago while sleeping   Squamous cell carcinoma of skin 06/02/2018   right neck, superficially invasive well differentiated   Squamous cell carcinoma of skin 07/22/2022   left mid back, EDC done  08/19/22   Thrombophlebitis of lower extremities    left   Past Surgical History:  Procedure Laterality Date   CARDIAC CATHETERIZATION  01/05/2008   COLONOSCOPY  07/09/2003   endocardiogram  07/09/2007   heart valve repair  07/09/2007   SPERMATOCELECTOMY Left 07/27/2019   Procedure: SPERMATOCELECTOMY;  Surgeon: Twylla Glendia BROCKS, MD;  Location: [facility] ORS;  Service: Urology;  Laterality: Left;   VARICOSE VEIN SURGERY     vein strip left leg   Family History  Problem Relation  Age of Onset   Prostate cancer Brother    Prostate cancer Brother    Early death Sister    Colon cancer Neg Hx    Social History   Socioeconomic History   Marital status: Married    Spouse name: Donzell   Number of children: 2   Years of education: Not on file   Highest education level: Bachelor's degree (e.g., BA, AB, BS)  Occupational History   Occupation: Retired    Associate Professor: [employer]  Tobacco Use   Smoking status: Former   Smokeless tobacco: Never   Tobacco comments:    quit 2009- 1-2 a day  Vaping Use   Vaping status: Never Used  Substance and Sexual Activity   Alcohol use: Not Currently    Alcohol/week: 1.0 standard drink of alcohol     Types: 1 Cans of beer per week    Comment: less than 2 beers per month   Drug use: No   Sexual activity: Not Currently    Birth control/protection: None  Other Topics Concern   Not on file  Social History Narrative   Pt lives alone. Moderate amount of caffeine daily. Regular exercise.   Social Drivers of Corporate investment banker Strain: Low Risk  (01/20/2024)   Overall Financial Resource Strain (CARDIA)    Difficulty of Paying Living Expenses: Not hard at all  Food Insecurity: No Food Insecurity (01/20/2024)   Hunger Vital Sign    Worried About Running Out of Food in the Last Year: Never true    Ran Out of Food in the Last Year: Never true  Transportation Needs: No Transportation Needs (01/20/2024)   PRAPARE - Administrator, Civil Service (Medical): No    Lack of Transportation (Non-Medical): No  Physical Activity: Sufficiently Active (01/20/2024)   Exercise Vital Sign    Days of Exercise per Week: 7 days    Minutes of Exercise per Session: 90 min  Stress: No Stress Concern Present (01/20/2024)   [employer] of Occupational Health - Occupational Stress Questionnaire    Feeling of Stress: Not at all  Social Connections: Moderately Integrated (01/20/2024)   Social Connection and Isolation Panel    Frequency of Communication with Friends and Family: More than three times a week    Frequency of Social Gatherings with Friends and Family: More than three times a week    Attends Religious Services: Never    Database administrator or Organizations: Yes    Attends Engineer, structural: More than 4 times per year    Marital Status: Married    Tobacco Counseling Counseling given: Not Answered Tobacco comments: quit 2009- 1-2 a day    Clinical Intake:  Pre-visit preparation completed: Yes  Pain : No/denies pain Pain Score: 0-No pain     BMI - recorded: 23.22 Nutritional Status: BMI of 19-24  Normal Nutritional Risks: None Diabetes: No  Lab  Results  Component Value Date   HGBA1C 5.6 12/07/2020   HGBA1C 5.6 06/17/2016   HGBA1C 5.8 (H) 06/19/2015     How often do you need to have someone help you when you read instructions, pamphlets, or other written materials from your doctor or pharmacy?: 1 - Never  Interpreter Needed?: No  Information entered by :: JHONNIE DAS, LPN   Activities of Daily Living     01/20/2024    1:58 PM 01/14/2024    4:56 PM  In your present state of health, do you have any difficulty  performing the following activities:  Hearing? 0 0  Vision? 0 0  Difficulty concentrating or making decisions? 0 0  Walking or climbing stairs? 0 0  Dressing or bathing? 0 0  Doing errands, shopping? 0 0  Preparing Food and eating ? N N  Using the Toilet? N N  In the past six months, have you accidently leaked urine? N N  Do you have problems with loss of bowel control? N N  Managing your Medications? N N  Managing your Finances? N N  Housekeeping or managing your Housekeeping? N N    Patient Care Team: Donzella Lauraine SAILOR, DO as PCP - General (Family Medicine) Maree Jannett POUR, MD as Consulting Physician (Neurology) Twylla Glendia BROCKS, MD (Urology) [facility], [facility]  I have updated your Care Teams any recent Medical Services you may have received from other providers in the past year.     Assessment:   This is a routine wellness examination for [name].  Hearing/Vision screen Hearing Screening - Comments:: NO AIDS Vision Screening - Comments:: READERS- PATTY VISION   Goals Addressed             This Visit's Progress    DIET - EAT MORE FRUITS AND VEGETABLES         Depression Screen     01/20/2024    1:52 PM 01/01/2024    8:11 AM 12/30/2022    8:11 AM 12/17/2022    2:55 PM 12/10/2021    8:42 AM 12/07/2020   10:00 AM 07/29/2019    1:24 PM  PHQ 2/9 Scores  PHQ - 2 Score 0 0 0 0 0 0 0  PHQ- 9 Score 0  0  0 0     Fall Risk     01/20/2024    1:58 PM 01/14/2024    4:56 PM 01/13/2024    1:51 PM  01/01/2024    8:10 AM 12/30/2022    8:11 AM  Fall Risk   Falls in the past year? 0 0 0 0 0  Number falls in past yr: 0 0 0 0   Injury with Fall? 0 0 0 0   Risk for fall due to : No Fall Risks   No Fall Risks No Fall Risks  Follow up Falls evaluation completed;Falls prevention discussed        MEDICARE RISK AT HOME:  Medicare Risk at Home Any stairs in or around the home?: Yes If so, are there any without handrails?: No Home free of loose throw rugs in walkways, pet beds, electrical cords, etc?: Yes Adequate lighting in your home to reduce risk of falls?: Yes Life alert?: No Use of a cane, walker or w/c?: No Grab bars in the bathroom?: No Shower chair or bench in shower?: No Elevated toilet seat or a handicapped toilet?: No  TIMED UP AND GO:  Was the test performed?  Yes  Length of time to ambulate 10 feet: 4 sec Gait steady and fast without use of assistive device  Cognitive Function: 6CIT completed        01/20/2024    2:00 PM 12/17/2022    2:57 PM  6CIT Screen  What Year? 0 points 0 points  What month? 0 points 0 points  What time? 0 points 0 points  Count back from 20 0 points 0 points  Months in reverse 0 points 0 points  Repeat phrase 0 points 0 points  Total Score 0 points 0 points    Immunizations  Immunization History  Administered Date(s) Administered   Influenza, High Dose Seasonal PF 03/31/2014, 06/19/2015, 06/17/2016, 06/23/2017, 06/25/2018   Influenza,inj,Quad PF,6+ Mos 08/23/2019   Influenza-Unspecified 04/12/2020, 03/25/2021, 03/24/2022, 02/20/2023   PFIZER Comirnaty(Gray Top)Covid-19 Tri-Sucrose Vaccine 10/19/2019, 11/09/2019, 06/14/2020   PFIZER(Purple Top)SARS-COV-2 Vaccination 10/19/2019, 11/09/2019, 06/14/2020   Pneumococcal Conjugate-13 12/13/2013   Pneumococcal Polysaccharide-23 12/09/2012   Tdap 11/05/2005, 11/25/2008, 08/25/2021    Screening Tests Health Maintenance  Topic Date Due   INFLUENZA VACCINE  02/06/2024   Medicare Annual  Wellness (AWV)  01/19/2025   DTaP/Tdap/Td (4 - Td or Tdap) 08/26/2031   Pneumococcal Vaccine: 50+ Years  Completed   Hepatitis C Screening  Completed   Hepatitis B Vaccines  Aged Out   HPV VACCINES  Aged Out   Meningococcal B Vaccine  Aged Out   Colonoscopy  Discontinued   COVID-19 Vaccine  Discontinued   Zoster Vaccines- Shingrix  Discontinued    Health Maintenance  There are no preventive care reminders to display for this patient. Health Maintenance Items Addressed: AGED OUT OF COLONOSCOPY; UP TO DATE ON TDAP- DECLINES PNA, COVID  Additional Screening:  Vision Screening: Recommended annual ophthalmology exams for early detection of glaucoma and other disorders of the eye. Would you like a referral to an eye doctor? No    Dental Screening: Recommended annual dental exams for proper oral hygiene  Community Resource Referral / Chronic Care Management: CRR required this visit?  No   CCM required this visit?  No   Plan:    I have personally reviewed and noted the following in the patient's chart:   Medical and social history Use of alcohol, tobacco or illicit drugs  Current medications and supplements including opioid prescriptions. Patient is not currently taking opioid prescriptions. Functional ability and status Nutritional status Physical activity Advanced directives List of other physicians Hospitalizations, surgeries, and ER visits in previous 12 months Vitals Screenings to include cognitive, depression, and falls Referrals and appointments  In addition, I have reviewed and discussed with patient certain preventive protocols, quality metrics, and best practice recommendations. A written personalized care plan for preventive services as well as general preventive health recommendations were provided to patient.   Jhonnie GORMAN Das, LPN   [id number]/84/7974   After Visit Summary: (In Person-Declined) Patient declined AVS at this time.  Notes: Nothing significant to  report at this time.

## 2024-01-20 NOTE — Patient Instructions (Addendum)
 Scott Potts , Thank you for taking time out of your busy schedule to complete your Annual Wellness Visit with me. I enjoyed our conversation and look forward to speaking with you again next year. I, as well as your care team,  appreciate your ongoing commitment to your health goals. Please review the following plan we discussed and let me know if I can assist you in the future.   Follow up Visits: Next Medicare AWV with our clinical staff:  01/25/25 @ 10:50 AM IN PERSON    Have you seen your provider in the last 6 months (3 months if uncontrolled diabetes)? Yes  Clinician Recommendations:  Aim for 30 minutes of exercise or brisk walking, 6-8 glasses of water, and 5 servings of fruits and vegetables each day. TAKE CARE!      This is a list of the screening recommended for you and due dates:  Health Maintenance  Topic Date Due   Flu Shot  02/06/2024   Medicare Annual Wellness Visit  01/19/2025   DTaP/Tdap/Td vaccine (4 - Td or Tdap) 08/26/2031   Pneumococcal Vaccine for age over 44  Completed   Hepatitis C Screening  Completed   Hepatitis B Vaccine  Aged Out   HPV Vaccine  Aged Out   Meningitis B Vaccine  Aged Out   Colon Cancer Screening  Discontinued   COVID-19 Vaccine  Discontinued   Zoster (Shingles) Vaccine  Discontinued    Advanced directives: (In Chart) A copy of your advanced directives are scanned into your chart should your provider ever need it. Advance Care Planning is important because it:  [x]  Makes sure you receive the medical care that is consistent with your values, goals, and preferences  [x]  It provides guidance to your family and loved ones and reduces their decisional burden about whether or not they are making the right decisions based on your wishes.  Follow the link provided in your after visit summary or read over the paperwork we have mailed to you to help you started getting your Advance Directives in place. If you need assistance in completing these, please  reach out to us  so that we can help you!

## 2024-01-22 ENCOUNTER — Ambulatory Visit: Payer: Self-pay | Admitting: Family Medicine

## 2024-01-22 DIAGNOSIS — I6523 Occlusion and stenosis of bilateral carotid arteries: Secondary | ICD-10-CM | POA: Insufficient documentation

## 2024-03-22 DIAGNOSIS — K080 Exfoliation of teeth due to systemic causes: Secondary | ICD-10-CM | POA: Diagnosis not present

## 2024-04-29 DIAGNOSIS — K080 Exfoliation of teeth due to systemic causes: Secondary | ICD-10-CM | POA: Diagnosis not present

## 2024-05-13 DIAGNOSIS — K080 Exfoliation of teeth due to systemic causes: Secondary | ICD-10-CM | POA: Diagnosis not present

## 2024-05-27 DIAGNOSIS — H524 Presbyopia: Secondary | ICD-10-CM | POA: Diagnosis not present

## 2024-05-30 DIAGNOSIS — M79671 Pain in right foot: Secondary | ICD-10-CM | POA: Diagnosis not present

## 2024-08-02 ENCOUNTER — Encounter: Payer: Medicare Other | Admitting: Dermatology

## 2024-08-02 ENCOUNTER — Ambulatory Visit: Admitting: Dermatology

## 2024-08-25 ENCOUNTER — Ambulatory Visit: Admitting: Dermatology

## 2024-09-06 ENCOUNTER — Ambulatory Visit: Admitting: Dermatology

## 2025-01-03 ENCOUNTER — Encounter: Admitting: Family Medicine

## 2025-01-25 ENCOUNTER — Ambulatory Visit
